# Patient Record
Sex: Female | Born: 1975 | Race: White | Hispanic: No | Marital: Married | Smoking: Never smoker
Health system: Southern US, Community
[De-identification: ages and names within clinical notes are randomized; demographics above are authoritative.]

## PROBLEM LIST (undated history)

## (undated) DIAGNOSIS — G43909 Migraine, unspecified, not intractable, without status migrainosus: Secondary | ICD-10-CM

## (undated) DIAGNOSIS — E538 Deficiency of other specified B group vitamins: Secondary | ICD-10-CM

## (undated) DIAGNOSIS — R5383 Other fatigue: Secondary | ICD-10-CM

## (undated) DIAGNOSIS — R0683 Snoring: Secondary | ICD-10-CM

## (undated) DIAGNOSIS — E559 Vitamin D deficiency, unspecified: Secondary | ICD-10-CM

## (undated) HISTORY — DX: Other fatigue: R53.83

## (undated) HISTORY — DX: Snoring: R06.83

## (undated) HISTORY — DX: Vitamin D deficiency, unspecified: E55.9

## (undated) HISTORY — PX: NO PAST SURGERIES: SHX2092

## (undated) HISTORY — DX: Migraine, unspecified, not intractable, without status migrainosus: G43.909

## (undated) HISTORY — DX: Deficiency of other specified B group vitamins: E53.8

---

## 2014-06-29 ENCOUNTER — Emergency Department (HOSPITAL_BASED_OUTPATIENT_CLINIC_OR_DEPARTMENT_OTHER): Payer: Medicaid Other

## 2014-06-29 ENCOUNTER — Emergency Department (HOSPITAL_BASED_OUTPATIENT_CLINIC_OR_DEPARTMENT_OTHER)
Admission: EM | Admit: 2014-06-29 | Discharge: 2014-06-30 | Disposition: A | Discharge status: Home / Self Care | Payer: Medicaid Other | Attending: Emergency Medicine | Admitting: Emergency Medicine

## 2014-06-29 ENCOUNTER — Encounter (HOSPITAL_BASED_OUTPATIENT_CLINIC_OR_DEPARTMENT_OTHER): Payer: Self-pay | Admitting: Emergency Medicine

## 2014-06-29 DIAGNOSIS — R1110 Vomiting, unspecified: Secondary | ICD-10-CM | POA: Insufficient documentation

## 2014-06-29 DIAGNOSIS — R55 Syncope and collapse: Secondary | ICD-10-CM | POA: Insufficient documentation

## 2014-06-29 DIAGNOSIS — R4020 Unspecified coma: Secondary | ICD-10-CM | POA: Diagnosis present

## 2014-06-29 DIAGNOSIS — R42 Dizziness and giddiness: Secondary | ICD-10-CM | POA: Diagnosis not present

## 2014-06-29 DIAGNOSIS — Z3202 Encounter for pregnancy test, result negative: Secondary | ICD-10-CM | POA: Diagnosis not present

## 2014-06-29 LAB — CBC WITH DIFFERENTIAL/PLATELET
BASOS ABS: 0.1 10*3/uL (ref 0.0–0.1)
Basophils Relative: 1 % (ref 0–1)
Eosinophils Absolute: 0.3 10*3/uL (ref 0.0–0.7)
Eosinophils Relative: 2 % (ref 0–5)
HCT: 39.9 % (ref 36.0–46.0)
Hemoglobin: 13.1 g/dL (ref 12.0–15.0)
LYMPHS ABS: 2.7 10*3/uL (ref 0.7–4.0)
LYMPHS PCT: 26 % (ref 12–46)
MCH: 28.8 pg (ref 26.0–34.0)
MCHC: 32.8 g/dL (ref 30.0–36.0)
MCV: 87.7 fL (ref 78.0–100.0)
Monocytes Absolute: 1 10*3/uL (ref 0.1–1.0)
Monocytes Relative: 9 % (ref 3–12)
NEUTROS PCT: 62 % (ref 43–77)
Neutro Abs: 6.6 10*3/uL (ref 1.7–7.7)
PLATELETS: 391 10*3/uL (ref 150–400)
RBC: 4.55 MIL/uL (ref 3.87–5.11)
RDW: 13.3 % (ref 11.5–15.5)
WBC: 10.7 10*3/uL — AB (ref 4.0–10.5)

## 2014-06-29 LAB — COMPREHENSIVE METABOLIC PANEL
ALT: 14 U/L (ref 0–35)
AST: 11 U/L (ref 0–37)
Albumin: 3.4 g/dL — ABNORMAL LOW (ref 3.5–5.2)
Alkaline Phosphatase: 106 U/L (ref 39–117)
Anion gap: 15 (ref 5–15)
BUN: 16 mg/dL (ref 6–23)
CO2: 21 meq/L (ref 19–32)
CREATININE: 0.8 mg/dL (ref 0.50–1.10)
Calcium: 8.9 mg/dL (ref 8.4–10.5)
Chloride: 105 mEq/L (ref 96–112)
GLUCOSE: 100 mg/dL — AB (ref 70–99)
Potassium: 3.7 mEq/L (ref 3.7–5.3)
Sodium: 141 mEq/L (ref 137–147)
Total Bilirubin: 0.3 mg/dL (ref 0.3–1.2)
Total Protein: 7.4 g/dL (ref 6.0–8.3)

## 2014-06-29 LAB — URINALYSIS, ROUTINE W REFLEX MICROSCOPIC
Bilirubin Urine: NEGATIVE
Glucose, UA: NEGATIVE mg/dL
Ketones, ur: NEGATIVE mg/dL
NITRITE: NEGATIVE
PH: 6 (ref 5.0–8.0)
Protein, ur: NEGATIVE mg/dL
Specific Gravity, Urine: 1.016 (ref 1.005–1.030)
Urobilinogen, UA: 0.2 mg/dL (ref 0.0–1.0)

## 2014-06-29 LAB — URINE MICROSCOPIC-ADD ON

## 2014-06-29 LAB — TROPONIN I: Troponin I: <0.3 ng/mL (ref <0.30)

## 2014-06-29 LAB — PREGNANCY, URINE: PREG TEST UR: NEGATIVE

## 2014-06-29 MED ORDER — MECLIZINE HCL 25 MG PO TABS
25.0000 mg | ORAL_TABLET | Freq: Once | ORAL | Status: AC
  Administered 2014-06-29: 25 mg via ORAL
  Filled 2014-06-29: qty 1

## 2014-06-29 MED ORDER — ONDANSETRON 4 MG PO TBDP
4.0000 mg | ORAL_TABLET | Freq: Once | ORAL | Status: AC
  Administered 2014-06-29: 4 mg via ORAL
  Filled 2014-06-29: qty 1

## 2014-06-29 NOTE — ED Provider Notes (Signed)
CSN: [id number]     Arrival date & time 06/29/14  2135 History  This chart was scribed for Charlesetta Shanks, MD by Rayfield Citizen, ED Scribe. This patient was seen in room MH11/MH11 and the patient's care was started at 10:04 PM.    Chief Complaint  Patient presents with  . Loss of Consciousness   The history is provided by the patient. No language interpreter was used.    HPI Comments: Kelly Marsh is a 38 y.o. female who presents to the Emergency Department complaining of new onset dizziness beginning this evening as she was getting ready for bed. She explains the sensation as spinning with an episode of "blacking out" where her vision was disturbed but she remained conscious and cognizant. Her dizziness is aggravated with position change. Patient reports that she has a history of headaches (for years), which last a week or two at a time with associated dizziness, but never to this level of severity. She states that she vomited 1x last night. She denies chest pain or nausea with these current symptoms.   She reports that she has had high blood pressure in the past but has not taken medication for that issue. Patient does not take any medication other than birth control pills.   History reviewed. No pertinent past medical history. History reviewed. No pertinent past surgical history. No family history on file. History  Substance Use Topics  . Smoking status: Never Smoker   . Smokeless tobacco: Not on file  . Alcohol Use: No   OB History   Grav Para Term Preterm Abortions TAB SAB Ect Mult Living                 Review of Systems  Cardiovascular: Negative for chest pain.  Gastrointestinal: Positive for vomiting (1x, 1 day before symptom onset). Negative for nausea.  Neurological: Positive for dizziness, syncope (Near syncope) and light-headedness.      Allergies  Review of patient's allergies indicates no known allergies.  Home Medications   Prior to Admission medications   Not on  File   BP 137/82  Pulse 78  Temp(Src) 98.3 F (36.8 C) (Oral)  Resp 20  Ht 5\' 6"  (1.676 m)  Wt 230 lb (104.327 kg)  BMI 37.14 kg/m2  SpO2 100%  LMP 06/15/2014 Physical Exam  Nursing note and vitals reviewed. Constitutional: She is oriented to person, place, and time. She appears well-developed and well-nourished.  HENT:  Head: Normocephalic and atraumatic.  Right Ear: External ear normal.  Left Ear: External ear normal.  Mouth/Throat: Oropharynx is clear and moist. No oropharyngeal exudate.  Eyes: Conjunctivae and EOM are normal. Pupils are equal, round, and reactive to light.  Cardiovascular: Normal rate, regular rhythm and normal heart sounds.  Exam reveals no gallop and no friction rub.   No murmur heard. Pulmonary/Chest: Effort normal and breath sounds normal. No respiratory distress. She has no wheezes. She has no rales.  Abdominal: Soft. There is no tenderness. There is no rebound and no guarding.  Musculoskeletal: Normal range of motion. She exhibits no edema.  Neurological: She is alert and oriented to person, place, and time.  Good symmetric strength, normal coordination with heel-to-shin  Skin: Skin is warm and dry. No rash noted.  Psychiatric: She has a normal mood and affect. Her behavior is normal.    ED Course  Procedures   DIAGNOSTIC STUDIES: Oxygen Saturation is 100% on RA, normal by my interpretation.    COORDINATION OF CARE: 10:10 PM Discussed  treatment plan with pt at bedside and pt agreed to plan.   Labs Review Labs Reviewed  COMPREHENSIVE METABOLIC PANEL - Abnormal; Notable for the following:    Glucose, Bld 100 (*)    Albumin 3.4 (*)    All other components within normal limits  CBC WITH DIFFERENTIAL - Abnormal; Notable for the following:    WBC 10.7 (*)    All other components within normal limits  URINALYSIS, ROUTINE W REFLEX MICROSCOPIC - Abnormal; Notable for the following:    Hgb urine dipstick MODERATE (*)    Leukocytes, UA TRACE (*)     All other components within normal limits  PREGNANCY, URINE  TROPONIN I  URINE MICROSCOPIC-ADD ON    Imaging Review Ct Head Wo Contrast  06/29/2014   CLINICAL DATA:  Loss of consciousness.  Initial encounter  EXAM: CT HEAD WITHOUT CONTRAST  TECHNIQUE: Contiguous axial images were obtained from the base of the skull through the vertex without intravenous contrast.  COMPARISON:  None.  FINDINGS: Skull and Sinuses:Negative for fracture or destructive process. The mastoids, middle ears, and imaged paranasal sinuses are clear.  Orbits: No acute abnormality.  Brain: No evidence of acute abnormality, such as acute infarction, hemorrhage, hydrocephalus, or mass lesion/mass effect. Globus pallidus calcification is prominent for age but not strictly pathologic.  IMPRESSION: No acute intracranial disease.   Electronically Signed   By: Jorje Guild M.D.   On: 06/29/2014 22:44     EKG Interpretation   Date/Time:  Monday June 29 2014 21:57:12 EDT Ventricular Rate:  84 PR Interval:  120 QRS Duration: 80 QT Interval:  374 QTC Calculation: 441 R Axis:   72 Text Interpretation:  Normal sinus rhythm Normal ECG agree Confirmed by  Johnney Killian, MD, Jeannie Done [phone]) on 06/29/2014 10:40:42 PM      MDM   Final diagnoses:  Vertigo  Near syncope    This point the patient has symptoms of vertigo with a long-standing history of headaches. CT head does not show any acute findings neurologic examination is normal. This does not sound cardiac syncope, EKG is normal. I will treat the patient for vertiginous symptoms and have her followup with her family physician.     Charlesetta Shanks, MD 06/30/14 0110

## 2014-06-29 NOTE — ED Notes (Signed)
States she passed out tonight while getting ready for bed. Denies pain.

## 2014-06-29 NOTE — ED Notes (Signed)
Patient transported to CT 

## 2014-06-30 MED ORDER — MECLIZINE HCL 50 MG PO TABS: 50.0000 mg | ORAL_TABLET | Freq: Three times a day (TID) | ORAL | Status: DC | PRN

## 2014-06-30 MED ORDER — ONDANSETRON HCL 4 MG PO TABS: 4.0000 mg | ORAL_TABLET | Freq: Four times a day (QID) | ORAL | Status: DC

## 2014-06-30 NOTE — Discharge Instructions (Signed)

## 2015-04-13 ENCOUNTER — Ambulatory Visit (INDEPENDENT_AMBULATORY_CARE_PROVIDER_SITE_OTHER): Payer: Medicaid Other | Admitting: Neurology

## 2015-04-13 ENCOUNTER — Encounter: Payer: Self-pay | Admitting: Neurology

## 2015-04-13 VITALS — BP 135/92 | HR 73 | Temp 98.1°F | Ht 66.0 in | Wt 245.6 lb

## 2015-04-13 DIAGNOSIS — G43101 Migraine with aura, not intractable, with status migrainosus: Secondary | ICD-10-CM

## 2015-04-13 DIAGNOSIS — R51 Headache: Secondary | ICD-10-CM | POA: Diagnosis not present

## 2015-04-13 DIAGNOSIS — R519 Headache, unspecified: Secondary | ICD-10-CM

## 2015-04-13 DIAGNOSIS — H538 Other visual disturbances: Secondary | ICD-10-CM | POA: Diagnosis not present

## 2015-04-13 DIAGNOSIS — H905 Unspecified sensorineural hearing loss: Secondary | ICD-10-CM | POA: Diagnosis not present

## 2015-04-13 DIAGNOSIS — H9190 Unspecified hearing loss, unspecified ear: Secondary | ICD-10-CM | POA: Insufficient documentation

## 2015-04-13 MED ORDER — TOPIRAMATE 25 MG PO TABS: 25.0000 mg | ORAL_TABLET | Freq: Two times a day (BID) | ORAL | Status: DC

## 2015-04-13 MED ORDER — SUMATRIPTAN SUCCINATE 100 MG PO TABS: 100.0000 mg | ORAL_TABLET | Freq: Once | ORAL | Status: DC

## 2015-04-13 NOTE — Progress Notes (Signed)
[facility]    Provider:  Dr Jaynee Eagles Referring Provider: Kristie Cowman, MD Primary Care Physician:  No primary care provider on file.  CC:  Headaches  HPI:  Kelly Marsh is a 39 y.o. female here as a referral from Dr. Ronnald Ramp for headaches.   Headaches started at the age of 79. No headaches in the family. No inciting factors, no trauma. Headaches are getting worse recently. They are more on the left but bilateraly in the forehead and it behind both eyes. She endorses light sensitivity, sound sensitivity which worsens headache. Feels throbbing. No aura. She has a continuous headache. Worse in the afternoon. She wakes up with them. She is excessively tired. She is taking Soma and tylenol 3. She uses zofran for nausea. She has nausea, she has vomited. She gets little bubbles before the headache starts. She uses cold rags which help. She can get to 10/10 pain. She has blurry vision with the headaches and lasts for days. Her head gets full, ears are full. Her scalp gets sensitive. She sometimes has to go into a dark room which helps. She always has a low-level headache 3-4/10 daily. Last time she had eyes checked was a while ago. She has had 5 kids, not having anymore and uses birth control.   Reviewed notes, labs and imaging from outside physicians, which showed:  CT head 06/2014: personally reviewed images Skull and Sinuses:Negative for fracture or destructive process. The mastoids, middle ears, and imaged paranasal sinuses are clear.  Orbits: No acute abnormality.  Brain: No evidence of acute abnormality, such as acute infarction, hemorrhage, hydrocephalus, or mass lesion/mass effect. Globus pallidus calcification is prominent for age but not strictly pathologic.  IMPRESSION: No acute intracranial disease.  Review of Systems: Patient complains of symptoms per HPI as well as the following symptoms: Headache. No CP, no SOB. Pertinent negatives per HPI. All others  negative.   History   Social History  . Marital Status: Married    Spouse Name: Tannya Gonet   . Number of Children: 5  . Years of Education: 10   Occupational History  . Not on file.   Social History Main Topics  . Smoking status: Never Smoker   . Smokeless tobacco: Not on file  . Alcohol Use: No  . Drug Use: No  . Sexual Activity: Not on file   Other Topics Concern  . Not on file   Social History Narrative   Lives at home with husband and family   Caffeine use: 1 cup coffee per week       Family History  Problem Relation Age of Onset  . Diabetes Mother   . Cancer Maternal Grandmother   . Cancer Maternal Grandfather     Past Medical History  Diagnosis Date  . Migraine     Past Surgical History  Procedure Laterality Date  . No past surgeries      Current Outpatient Prescriptions  Medication Sig Dispense Refill  . acetaminophen-codeine (TYLENOL #3) 300-30 MG per tablet Take 1 tablet by mouth as needed.  0  . CAZIANT 0.1/0.125/0.15 -0.025 MG tablet TK 1 T PO D  11  . methocarbamol (ROBAXIN) 500 MG tablet Take 500 mg by mouth as needed.  0  . ondansetron (ZOFRAN) 8 MG tablet Take 8 mg by mouth as needed.  0  . meclizine (ANTIVERT) 50 MG tablet Take 1 tablet (50 mg total) by mouth 3 (three) times daily as needed. (Patient not taking: Reported on 04/13/2015) 30  tablet 0   No current facility-administered medications for this visit.    Allergies as of 04/13/2015  . (No Known Allergies)    Vitals: BP 135/92 mmHg  Pulse 73  Temp(Src) 98.1 F (36.7 C) (Oral)  Ht 5\' 6"  (1.676 m)  Wt 245 lb 9.6 oz (111.403 kg)  BMI 39.66 kg/m2 Last Weight:  Wt Readings from Last 1 Encounters:  04/13/15 245 lb 9.6 oz (111.403 kg)   Last Height:   Ht Readings from Last 1 Encounters:  04/13/15 5\' 6"  (1.676 m)   Physical exam: Exam: Gen: NAD, conversant, well nourised, well groomed                     CV: RRR, no MRG. No Carotid Bruits. No peripheral edema, warm,  nontender Eyes: Conjunctivae clear without exudates or hemorrhage  Neuro: Detailed Neurologic Exam  Speech:    Speech is normal; fluent and spontaneous with normal comprehension.  Cognition:    The patient is oriented to person, place, and time;     recent and remote memory intact;     language fluent;     normal attention, concentration,     fund of knowledge Cranial Nerves:    The pupils are equal, round, and reactive to light. The fundi are normal and spontaneous venous pulsations are present. Visual fields are full to finger confrontation. Extraocular movements are intact. Trigeminal sensation is intact and the muscles of mastication are normal. The face is symmetric. The palate elevates in the midline. Hearing intact. Voice is normal. Shoulder shrug is normal. The tongue has normal motion without fasciculations.   Coordination:    Normal finger to nose and heel to shin. Normal rapid alternating movements.   Gait:    Heel-toe and tandem gait are normal.   Motor Observation:    No asymmetry, no atrophy, and no involuntary movements noted. Tone:    Normal muscle tone.    Posture:    Posture is normal. normal erect    Strength:    Strength is V/V in the upper and lower limbs.      Sensation: intact to LT     Reflex Exam:  DTR's:    Deep tendon reflexes in the upper and lower extremities are normal bilaterally.   Toes:    The toes are downgoing bilaterally.   Clonus:    Clonus is absent.       Assessment/Plan:  39 year old female with migraines.   Topiramate 25mg  twice daily. If needed we can increase medication. Do not get pregnant. Discussed teratogenicity. Use birth control.  imitrex 100mg  at onset of headache. Please take one tablet at the onset of your headache. If it does not improve the symptoms please take one additional tablet. Do not take more then 2 tablets in 24hrs. Do not take use more then 2 to 3 times in a week. Do not take OTC medications more than  2-3x in a week to avoid rebound.  MRi of the brain  Sarina Ill, MD  [facility] [address] [place], [place] [address]  Phone [phone] Fax [phone]

## 2015-04-13 NOTE — Patient Instructions (Signed)
Overall you are doing fairly well but I do want to suggest a few things today:   Remember to drink plenty of fluid, eat healthy meals and do not skip any meals. Try to eat protein with a every meal and eat a healthy snack such as fruit or nuts in between meals. Try to keep a regular sleep-wake schedule and try to exercise daily, particularly in the form of walking, 20-30 minutes a day, if you can.   As far as your medications are concerned, I would like to suggest: Topiramate 25mg  twice daily. If needed we can increase medication. Do not get pregnant. imitrex 100mg  at onset of headache. Please take one tablet at the onset of your headache. If it does not improve the symptoms please take one additional tablet. Do not take more then 2 tablets in 24hrs. Do not take use more then 2 to 3 times in a week. Do not take OTC medications more than 2-3x in a week to avoid rebound.   As far as diagnostic testing: MRI of the brain  I would like to see you back in 3 months, sooner if we need to. Please call us with any interim questions, concerns, problems, updates or refill requests.   Please also call us for any test results so we can go over those with you on the phone.  My clinical assistant and will answer any of your questions and relay your messages to me and also relay most of my messages to you.   Our phone number is [phone]. We also have an after hours call service for urgent matters and there is a physician on-call for urgent questions. For any emergencies you know to call 911 or go to the nearest emergency room

## 2015-04-14 ENCOUNTER — Telehealth: Payer: Self-pay | Admitting: *Deleted

## 2015-04-14 LAB — COMPREHENSIVE METABOLIC PANEL
ALT: 11 IU/L (ref 0–32)
AST: 9 IU/L (ref 0–40)
Albumin/Globulin Ratio: 1.5 (ref 1.1–2.5)
Albumin: 4.3 g/dL (ref 3.5–5.5)
Alkaline Phosphatase: 91 IU/L (ref 39–117)
BUN/Creatinine Ratio: 16 (ref 8–20)
BUN: 11 mg/dL (ref 6–20)
Bilirubin Total: 0.4 mg/dL (ref 0.0–1.2)
CO2: 21 mmol/L (ref 18–29)
Calcium: 9.5 mg/dL (ref 8.7–10.2)
Chloride: 102 mmol/L (ref 97–108)
Creatinine, Ser: 0.7 mg/dL (ref 0.57–1.00)
GFR calc Af Amer: 127 mL/min/{1.73_m2} (ref >59)
GFR, EST NON AFRICAN AMERICAN: 110 mL/min/{1.73_m2} (ref >59)
GLUCOSE: 100 mg/dL — AB (ref 65–99)
Globulin, Total: 2.9 g/dL (ref 1.5–4.5)
POTASSIUM: 5.2 mmol/L (ref 3.5–5.2)
SODIUM: 139 mmol/L (ref 134–144)
Total Protein: 7.2 g/dL (ref 6.0–8.5)

## 2015-04-14 LAB — CBC
HEMOGLOBIN: 14.1 g/dL (ref 11.1–15.9)
Hematocrit: 43.4 % (ref 34.0–46.6)
MCH: 28.3 pg (ref 26.6–33.0)
MCHC: 32.5 g/dL (ref 31.5–35.7)
MCV: 87 fL (ref 79–97)
PLATELETS: 445 10*3/uL — AB (ref 150–379)
RBC: 4.99 x10E6/uL (ref 3.77–5.28)
RDW: 13.4 % (ref 12.3–15.4)
WBC: 8.6 10*3/uL (ref 3.4–10.8)

## 2015-04-14 NOTE — Telephone Encounter (Signed)
Spoke w/ pt about unremarkable lab results. Pt verbalized understanding. No further questions at this time.

## 2015-04-18 ENCOUNTER — Encounter: Payer: Self-pay | Admitting: Neurology

## 2015-06-30 ENCOUNTER — Ambulatory Visit (INDEPENDENT_AMBULATORY_CARE_PROVIDER_SITE_OTHER): Payer: Medicaid Other | Admitting: Neurology

## 2015-06-30 ENCOUNTER — Encounter: Payer: Self-pay | Admitting: Neurology

## 2015-06-30 VITALS — BP 143/90 | HR 78 | Ht 66.0 in | Wt 243.0 lb

## 2015-06-30 DIAGNOSIS — R519 Headache, unspecified: Secondary | ICD-10-CM

## 2015-06-30 DIAGNOSIS — R51 Headache: Secondary | ICD-10-CM | POA: Diagnosis not present

## 2015-06-30 DIAGNOSIS — G43101 Migraine with aura, not intractable, with status migrainosus: Secondary | ICD-10-CM | POA: Diagnosis not present

## 2015-06-30 DIAGNOSIS — R0683 Snoring: Secondary | ICD-10-CM | POA: Diagnosis not present

## 2015-06-30 DIAGNOSIS — G4719 Other hypersomnia: Secondary | ICD-10-CM | POA: Diagnosis not present

## 2015-06-30 MED ORDER — NORTRIPTYLINE HCL 10 MG PO CAPS: 20.0000 mg | ORAL_CAPSULE | Freq: Every day | ORAL | Status: DC

## 2015-06-30 NOTE — Patient Instructions (Addendum)
Overall you are doing fairly well but I do want to suggest a few things today:   Remember to drink plenty of fluid, eat healthy meals and do not skip any meals. Try to eat protein with a every meal and eat a healthy snack such as fruit or nuts in between meals. Try to keep a regular sleep-wake schedule and try to exercise daily, particularly in the form of walking, 20-30 minutes a day, if you can.   As far as your medications are concerned, I would like to suggest: Nortriptyline 10mg  an hour before bed. In 2-3 weeks if without side effects can increase to 2 pills. Stop Topamax and Imitrex,  As far as diagnostic testing: MRI of the brain, sleep study  I would like to see you back in 3 months, sooner if we need to. Please call us with any interim questions, concerns, problems, updates or refill requests.   Our phone number is [phone]. We also have an after hours call service for urgent matters and there is a physician on-call for urgent questions. For any emergencies you know to call 911 or go to the nearest emergency room

## 2015-06-30 NOTE — Progress Notes (Signed)
[facility]    Provider:  Dr Jaynee Eagles Referring Provider: No ref. provider found Primary Care Physician:  No primary care provider on file.  Provider: Dr Jaynee Eagles Referring Provider: Kristie Cowman, MD Primary Care Physician: No primary care provider on file.  CC: Headaches  Interval History: She started the Topamax a month into it. She was having side effects to the Topamax and the imitrex. The imitrex made her feel like she she was choking. The topamax made her feel dizzy and headaches didn;t get better. She is having the headaches constantly for 8 days and then gets a break and then they restart. She snores, she wake up with headaches, she woke up with one at 4am and she can't think straight. Excessively tired during the day, she can take a nap anytime, she gained weight recently. In September she had 20 headache days and in October so far she has 10 headache days. She has insomnia very often. Sounds are triggers and she gets headaches when she is working out.   HPI: Kelly Marsh is a 39 y.o. female here as a referral from Dr. Ronnald Ramp for headaches.   Headaches started at the age of 62. No headaches in the family. No inciting factors, no trauma. Headaches are getting worse recently. They are more on the left but bilateraly in the forehead and it behind both eyes. She endorses light sensitivity, sound sensitivity which worsens headache. Feels throbbing. No aura. She has a continuous headache. Worse in the afternoon. She wakes up with them. She is excessively tired. She is taking Soma and tylenol 3. She uses zofran for nausea. She has nausea, she has vomited. She gets little bubbles before the headache starts. She uses cold rags which help. She can get to 10/10 pain. She has blurry vision with the headaches and lasts for days. Her head gets full, ears are full. Her scalp gets sensitive. She sometimes has to go into a dark room which helps. She always has a low-level headache 3-4/10  daily. Last time she had eyes checked was a while ago. She has had 5 kids, not having anymore and uses birth control.   Reviewed notes, labs and imaging from outside physicians, which showed:  CT head 06/2014: personally reviewed images Skull and Sinuses:Negative for fracture or destructive process. The mastoids, middle ears, and imaged paranasal sinuses are clear.  Orbits: No acute abnormality.  Brain: No evidence of acute abnormality, such as acute infarction, hemorrhage, hydrocephalus, or mass lesion/mass effect. Globus pallidus calcification is prominent for age but not strictly pathologic.  IMPRESSION: No acute intracranial disease. Review of Systems: Patient complains of symptoms per HPI as well as the following symptoms: No CP, No SOB. Pertinent negatives per HPI. All others negative.   Social History   Social History  . Marital Status: Married    Spouse Name: Angelic Schnelle   . Number of Children: 5  . Years of Education: 10   Occupational History  . Not on file.   Social History Main Topics  . Smoking status: Never Smoker   . Smokeless tobacco: Not on file  . Alcohol Use: No  . Drug Use: No  . Sexual Activity: Not on file   Other Topics Concern  . Not on file   Social History Narrative   Lives at home with husband and family   Caffeine use: 1 cup coffee per week       Family History  Problem Relation Age of Onset  . Diabetes Mother   .  Cancer Maternal Grandmother   . Cancer Maternal Grandfather   . Migraines Neg Hx     Past Medical History  Diagnosis Date  . Migraine     Past Surgical History  Procedure Laterality Date  . No past surgeries      Current Outpatient Prescriptions  Medication Sig Dispense Refill  . CAZIANT 0.1/0.125/0.15 -0.025 MG tablet TK 1 T PO D  11  . meclizine (ANTIVERT) 50 MG tablet Take 1 tablet (50 mg total) by mouth 3 (three) times daily as needed. 30 tablet 0  . ondansetron (ZOFRAN) 8 MG tablet Take 8 mg by mouth as  needed.  0  . SUMAtriptan (IMITREX) 100 MG tablet Take 1 tablet (100 mg total) by mouth once. May repeat in 2 hours if headache persists or recurs. 10 tablet 6  . topiramate (TOPAMAX) 25 MG tablet Take 1 tablet (25 mg total) by mouth 2 (two) times daily. 60 tablet 6   No current facility-administered medications for this visit.    Allergies as of 06/30/2015  . (No Known Allergies)    Vitals: BP 143/90 mmHg  Pulse 78  Ht 5\' 6"  (1.676 m)  Wt 243 lb (110.224 kg)  BMI 39.24 kg/m2 Last Weight:  Wt Readings from Last 1 Encounters:  06/30/15 243 lb (110.224 kg)   Last Height:   Ht Readings from Last 1 Encounters:  06/30/15 5\' 6"  (1.676 m)       Exam: Gen: NAD, conversant, well nourised, well groomed  CV: RRR, no MRG. No Carotid Bruits. No peripheral edema, warm, nontender Eyes: Conjunctivae clear without exudates or hemorrhage  Neuro: Detailed Neurologic Exam  Speech:  Speech is normal; fluent and spontaneous with normal comprehension.  Cognition:  The patient is oriented to person, place, and time;   recent and remote memory intact;   language fluent;   normal attention, concentration,   fund of knowledge Cranial Nerves:  The pupils are equal, round, and reactive to light. The fundi are normal and spontaneous venous pulsations are present. Visual fields are full to finger confrontation. Extraocular movements are intact. Trigeminal sensation is intact and the muscles of mastication are normal. The face is symmetric. The palate elevates in the midline. Hearing intact. Voice is normal. Shoulder shrug is normal. The tongue has normal motion without fasciculations.   Coordination:  Normal finger to nose and heel to shin. Normal rapid alternating movements.   Gait:  Heel-toe and tandem gait are normal.   Motor Observation:  No asymmetry, no atrophy, and no involuntary movements noted. Tone:  Normal muscle tone.    Posture:  Posture is normal. normal erect   Strength:  Strength is V/V in the upper and lower limbs.    Sensation: intact to LT   Reflex Exam:  DTR's:  Deep tendon reflexes in the upper and lower extremities are normal bilaterally.  Toes:  The toes are downgoing bilaterally.  Clonus:  Clonus is absent.      Assessment/Plan: 39 year old female with migraines.   Sleep study for nocturnal headaches waking her up, smoring, excessive daytime fatigue, obesity. Nortriptyline 10mg  an hour before bed. In 2-3 weeks if without side effects can increase to 2 pills. Stop Topamax and Imitrex, MRI of the brain Discussed side effects including teratogenicity, do not Pregnant on this medication and use birth control. Serious side effects can include hypotension, hypertension, syncope, ventricular arrhythmias, QT prolongation and other cardiac side effects, stroke and seizures, ataxia tardive dyskinesias, extrapyramidal symptoms, increased intraocular  pressure, leukopenia, thrombocytopenia, hallucinations, suicidality and other serious side effects. Common reactions include drowsiness, dry mouth, dizziness, constipation, blurred vision, palpitations, tachycardia, impaired coordination, increased appetite, nausea vomiting, weakness, confusion, disorientation, restlessness, anxiety and other side effects.  A total of 25 minutes was spent in with this patient face to face. Over half this time was spent on counseling patient on the migraine diagnosis and different therapeutic options available.

## 2015-07-05 ENCOUNTER — Encounter: Payer: Self-pay | Admitting: Neurology

## 2015-07-05 ENCOUNTER — Ambulatory Visit (INDEPENDENT_AMBULATORY_CARE_PROVIDER_SITE_OTHER): Payer: Medicaid Other | Admitting: Neurology

## 2015-07-05 VITALS — BP 132/88 | HR 82 | Resp 20 | Ht 66.0 in | Wt 243.0 lb

## 2015-07-05 DIAGNOSIS — R0683 Snoring: Secondary | ICD-10-CM | POA: Diagnosis not present

## 2015-07-05 DIAGNOSIS — G43111 Migraine with aura, intractable, with status migrainosus: Secondary | ICD-10-CM | POA: Diagnosis not present

## 2015-07-05 DIAGNOSIS — G4730 Sleep apnea, unspecified: Secondary | ICD-10-CM | POA: Diagnosis not present

## 2015-07-05 DIAGNOSIS — G4753 Recurrent isolated sleep paralysis: Secondary | ICD-10-CM

## 2015-07-05 DIAGNOSIS — G44011 Episodic cluster headache, intractable: Secondary | ICD-10-CM | POA: Diagnosis not present

## 2015-07-05 NOTE — Patient Instructions (Signed)
Cluster Headache Cluster headaches are deeply painful. They normally occur on one side of your head, but they may switch sides. Often, cluster headaches:  Are severe.  Happen often for a few weeks or months and then go away for a while.  Last from 15 minutes to 3 hours.  Happen at the same time each day.  Happen at night.  Happen many times a day. HOME CARE  During times when you have cluster headaches:  Get the same amount of sleep every night, at the same time each night.  Avoid alcohol.  Stop smoking if you smoke. GET HELP IF:  There are changes in how bad or how often your headaches happen.  Your medicines are not helping. GET HELP RIGHT AWAY IF:  You pass out (faint).  You become weak or lose feeling (have numbness) on one side of your body or face.  You see two of everything (double vision).  You feel sick to your stomach (nauseous) or throw up (vomit) and do not stop after several hours.  You are off balance or have trouble talking or walking.  You have neck pain or stiffness.  You have a fever. MAKE SURE YOU:  Understand these instructions.  Will watch your condition.  Will get help right away if you are not doing well or get worse.   This information is not intended to replace advice given to you by your health care provider. Make sure you discuss any questions you have with your health care provider.   Document Released: 10/12/2004 Document Revised: 09/25/2014 Document Reviewed: 03/27/2013 Elsevier Interactive Patient Education 2016 [employer]  

## 2015-07-05 NOTE — Progress Notes (Signed)
SLEEP MEDICINE CLINIC   Provider:  Larey Seat, M D  Referring Provider: Melvenia Beam, MD Primary Care Physician:  No primary care provider on file.  Chief Complaint  Patient presents with  . New Patient (Initial Visit)    snoring, sleep consult, headaches in morning, rm 10, with husband and daughter    HPI:  Kelly Marsh is a 39 y.o. female , seen here as a referral from Kelly Marsh for a sleep evaluation  Chief complaint according to patient : I am awoken by headaches.    [name] reports that she has had sleep related headaches for many years. Some headaches will wake her from sleep and are severe sudden headache attacks. She sometimes cannot fall back asleep after those attacks and they seem to appear in clusters. She tends to have about 8 day days of serial nights with headaches and then she will be off for couple of weeks. This is a cyclic pattern that has been present for over a year may be longer than 5 years. She has now reached a stage where she has more days with headaches than without. She also awakens with headaches in the morning and states that the headaches do not resolve over the next couple of hours but actually get worse. But her nocturnal headaches affect her left eye left temple and sometimes cause her to tear or to feel flushed her morning headaches have a different quality. They feel much more like a vice around her head for head and temples, lights and sounds seem to trigger headaches. She will get also nauseated also she may not have to vomit. Is a migrainous and tension component to her headaches.  Sleep habits are as follows: She describes her bedroom is cool and quiet and dark, she retires to the bedroom around 8 PM, she states that her average sleep latency may be around 30 minutes sometimes longer. She also has fragmented sleep and wakes up frequently. Not all of her wake up periods are related to headaches, she will on average wake up twice at night to  urinate. She wakes up in the morning around 6:15 spontaneously without an alarm. Often by that time she has already woken up at 5 or 4 AM.  She estimates about 5 hours of sleep, nocturnally.  She reports vivid dreams in the morning hours that may also lead to these arousals. Some of them are nightmarish in character but most of them are just vivid. Sometimes when the dream is interrupted and she falls back asleep, she can continue the same dream content. She has woken up with sleep paralysis she has also reported some paralysis attacks in daytime. Her husband will wake her up if she sleeps supine, due to him bothered by loud snoirng. He has witnessed gasping and irregular breathing as well.  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 She can take daytime naps. She feels sometimes an irresistible urge to sleep. A nap can be in the morning hours as well as in the afternoon, she does not feel that the naps are refreshing or restoring. This seemed to promote a headache , too.  She does not drink caffeine at it beverages. She no longer takes NSAIDS.    Sleep medical history and family sleep history:  No family history , one son snores. Mother snores.  Social history:  Married, homemaker. Non smoker  Non drinker.   Review of Systems: Out of a complete 14  system review, the patient complains of only the following symptoms, and all other reviewed systems are negative.   Epworth score  11, Fatigue severity score 60  , depression score see below.    Social History   Social History  . Marital Status: Married    [name]   . Number of Children: 5  . Years of Education: 10   Occupational History  . Not on file.   Social History Main Topics  . Smoking status: Never Smoker   . Smokeless tobacco: Not on file  . Alcohol Use: No  . Drug Use: No  . Sexual Activity: Not on file   Other Topics Concern  . Not on file   Social History Narrative   Lives at home with husband  and family   Caffeine use: 1 cup coffee per week       Family History  Problem Relation Age of Onset  . Diabetes Mother   . Cancer Maternal Grandmother   . Cancer Maternal Grandfather   . Migraines Neg Hx     Past Medical History  Diagnosis Date  . Migraine   . Snoring     Past Surgical History  Procedure Laterality Date  . No past surgeries      Current Outpatient Prescriptions  Medication Sig Dispense Refill  . CAZIANT 0.1/0.125/0.15 -0.025 MG tablet TK 1 T PO D  11  . nortriptyline (PAMELOR) 10 MG capsule Take 2 capsules (20 mg total) by mouth at bedtime. 60 capsule 6  . ondansetron (ZOFRAN) 8 MG tablet Take 8 mg by mouth as needed.  0  . meclizine (ANTIVERT) 50 MG tablet Take 1 tablet (50 mg total) by mouth 3 (three) times daily as needed. (Patient not taking: Reported on 07/05/2015) 30 tablet 0   No current facility-administered medications for this visit.    Allergies as of 07/05/2015  . (No Known Allergies)    Vitals: BP 132/88 mmHg  Pulse 82  Resp 20  Ht 5\' 6"  (1.676 m)  Wt 243 lb (110.224 kg)  BMI 39.24 kg/m2 Last Weight:  Wt Readings from Last 1 Encounters:  07/05/15 243 lb (110.224 kg)   WVP:XTGG mass index is 39.24 kg/(m^2).     Last Height:   Ht Readings from Last 1 Encounters:  07/05/15 5\' 6"  (1.676 m)    Physical exam:  General: The patient is awake, alert and appears not in acute distress. The patient is well groomed. Head: Normocephalic, atraumatic. Neck is supple. Mallampati 2 , no tonsillectomy neck circumference: 14 . Nasal airflow  Unrestricted , TMJ is evident .  Bruxism. Retrognathia is not seen. No dentures and no history of retainers.   Cardiovascular:  Regular rate and rhythm, without  murmurs or carotid bruit, and without distended neck veins. Respiratory: Lungs are clear to auscultation. Skin:  Without evidence of edema, or rash Trunk: BMI is elevated . The patient's posture is erect   Neurologic exam : The patient is awake  and alert, oriented to place and time.   Memory subjective described as intact.     Attention span & concentration ability appears normal.  Speech is fluent,  without dysarthria, dysphonia or aphasia.  Mood and affect are appropriate.  Cranial nerves: Pupils are equal and briskly reactive to light. Funduscopic exam without  evidence of pallor or edema.  Extraocular movements  in vertical and horizontal planes intact and without nystagmus. Visual fields by finger perimetry are intact. Hearing to finger rub  intact.   Facial sensation intact to fine touch.  Facial motor strength is symmetric and tongue and uvula move midline. Shoulder shrug was symmetrical.   Motor exam:  Normal tone, muscle bulk and symmetric strength in all extremities.  Sensory:  Fine touch, pinprick and vibration were tested in all extremities. Proprioception tested in the upper extremities was normal.  Coordination: Rapid alternating movements in the fingers/hands was normal. Finger-to-nose maneuver  normal without evidence of ataxia, dysmetria or tremor.  Gait and station: Patient walks without assistive device and is able unassisted to climb up to the exam table. Strength within normal limits.  Stance is stable and normal.  Tandem gait is unfragmented. Turns with 3  Steps. Deep tendon reflexes: in the  upper and lower extremities are symmetric and intact. Babinski maneuver response is down going downgoing.  The patient was advised of the nature of the diagnosed sleep disorder , the treatment options and risks for general a health and wellness arising from not treating the condition.  I spent more than 55  minutes of face to face time with the patient. Greater than 50% of time was spent in counseling and coordination of care. We have discussed the diagnosis and differential and I answered the patient's questions.     Assessment:  After physical and neurologic examination, review of laboratory studies,  Personal review  of imaging studies, reports of other /same  Imaging studies ,  Results of polysomnography/ neurophysiology testing and pre-existing records as far as provided in visit., my assessment is   1) Mrs. Thammavong describes sleep related headaches, in  distinctly different patterns. #1 is a cluster headache that wakes her in the middle of the night often in the earlier hours of the morning with an the first 2 hours of falling asleep. His will not last very long but is a severe sudden attack it causes her to have tearing in her left eye a pressure or stabbing sensation to the left had and flushing.  #2The second is a morning headache that she wakes up with but is not awoken by. This headache is more dull that of a tension component with advice feeling around her head. It is not unilateral but associated with photophobia, phonophobia and causes her to feel nauseated, all bite she rarely ever has to vomit from it. She is definitely not tolerating food and she has this kind of sensation.  3) Mr. Thoreson has seen her breathing irregularly, and he has witnessed snoring which seems to be positional dependent and worse in supine sleep.  4) Mrs. Kinnamon has significant risk factors for obstructive sleep apnea and possible CO2 retention.  She has an elevated body mass index, her upper airway is moderately narrowed, and she prefers to sleep on the side as she wakens more frequently if she is in supine. 5)She is also significantly depressed.     Plan:  Treatment plan and additional workup : I will order an attended sleep study for Mrs. Hege that includes capnography to measure CO2 retention pulse oximetry to measure O2 saturation and I'm interested in seeing as the cluster headaches that she describes arise out of slow-wave sleep or REM sleep. She has reported isolated sleep paralysis even in daytime and vivid dreams as well as very fragmented sleep. However she does not really feel refreshed by naps in daytime. She is not aware  of any cataplectic attacks.     Asencion Partridge Alfonza Toft MD  07/05/2015   CC: Melvenia Beam, Md  [place], [place] [address]

## 2015-07-12 ENCOUNTER — Ambulatory Visit: Payer: Medicaid Other | Admitting: Neurology

## 2015-07-18 ENCOUNTER — Encounter: Payer: Self-pay | Admitting: Neurology

## 2015-07-19 ENCOUNTER — Other Ambulatory Visit: Payer: Self-pay | Admitting: Neurology

## 2015-07-19 MED ORDER — NORTRIPTYLINE HCL 50 MG PO CAPS: 50.0000 mg | ORAL_CAPSULE | Freq: Every day | ORAL | Status: DC

## 2015-07-20 ENCOUNTER — Ambulatory Visit (INDEPENDENT_AMBULATORY_CARE_PROVIDER_SITE_OTHER): Payer: Medicaid Other | Admitting: Neurology

## 2015-07-20 DIAGNOSIS — G44011 Episodic cluster headache, intractable: Secondary | ICD-10-CM

## 2015-07-20 DIAGNOSIS — G4753 Recurrent isolated sleep paralysis: Secondary | ICD-10-CM

## 2015-07-20 DIAGNOSIS — G4730 Sleep apnea, unspecified: Secondary | ICD-10-CM

## 2015-07-20 DIAGNOSIS — G43111 Migraine with aura, intractable, with status migrainosus: Secondary | ICD-10-CM

## 2015-07-20 DIAGNOSIS — R0683 Snoring: Secondary | ICD-10-CM

## 2015-07-21 NOTE — Sleep Study (Signed)
Please see the scanned sleep study interpretation located in the procedure tab in the chart view section.  

## 2015-08-02 ENCOUNTER — Telehealth: Payer: Self-pay

## 2015-08-02 NOTE — Telephone Encounter (Signed)
Spoke to pt and advised her that her sleep study revealed no organic primary sleep disorder. Hypersomnia remains unexplained. Hypoxemia nor hypercapnia were found and thereby no correlation to headache disorder. I advised an ENT examination if snoring is a concern. I offered an appt with Dr. Brett Fairy to discuss sleep results further, but she declined at this time. She said she would call if she wanted an appt. I advised her to at least continue following up with Dr. Jaynee Eagles regarding her headaches. Pt verbalized understanding.

## 2015-09-14 ENCOUNTER — Encounter: Payer: Self-pay | Admitting: Neurology

## 2015-09-14 MED ORDER — PROPRANOLOL HCL 40 MG PO TABS: 40.0000 mg | ORAL_TABLET | Freq: Two times a day (BID) | ORAL | Status: DC

## 2015-09-14 MED ORDER — RIZATRIPTAN BENZOATE 5 MG PO TBDP: 5.0000 mg | ORAL_TABLET | ORAL | Status: DC | PRN

## 2015-09-14 NOTE — Telephone Encounter (Addendum)
Chart reviewed, patient was seen by Dr. Jaynee Eagles in October 2016 for migraine headaches, tried and failed Topamax, nortriptyline failed to help her headaches  Please call patient, I have added on Inderal 40 mg twice a day as preventive medication, Maxalt as needed

## 2015-09-15 ENCOUNTER — Telehealth: Payer: Self-pay

## 2015-09-15 NOTE — Telephone Encounter (Signed)
Please see email. I spoke to patient and she is aware that Dr. Krista Blue sent in a new prescription for her migraines. (Dr. Jaynee Eagles is out of the office). Patient has picked up Rx and will call if any problems.

## 2015-10-06 ENCOUNTER — Ambulatory Visit: Payer: Medicaid Other | Admitting: Neurology

## 2015-10-26 ENCOUNTER — Ambulatory Visit (INDEPENDENT_AMBULATORY_CARE_PROVIDER_SITE_OTHER): Payer: Medicaid Other | Admitting: Neurology

## 2015-10-26 ENCOUNTER — Encounter: Payer: Self-pay | Admitting: Neurology

## 2015-10-26 VITALS — BP 150/103 | HR 115 | Ht 66.0 in | Wt 239.4 lb

## 2015-10-26 DIAGNOSIS — G43709 Chronic migraine without aura, not intractable, without status migrainosus: Secondary | ICD-10-CM

## 2015-10-26 MED ORDER — NAPROXEN 500 MG PO TABS: 500.0000 mg | ORAL_TABLET | Freq: Two times a day (BID) | ORAL | Status: DC | PRN

## 2015-10-26 MED ORDER — NORTRIPTYLINE HCL 75 MG PO CAPS: 75.0000 mg | ORAL_CAPSULE | Freq: Every day | ORAL | Status: DC

## 2015-10-26 NOTE — Patient Instructions (Signed)
Remember to drink plenty of fluid, eat healthy meals and do not skip any meals. Try to eat protein with a every meal and eat a healthy snack such as fruit or nuts in between meals. Try to keep a regular sleep-wake schedule and try to exercise daily, particularly in the form of walking, 20-30 minutes a day, if you can.   As far as your medications are concerned, I would like to suggest:  Nortriptyline 75mg  at night Naproxen 500mg  twice daily. Start 2 days before period and take for 5 days, take with food, stop for dark stools or GI upset Botox for migraine Continue the maxalt  I would like to see you back for botox, sooner if we need to. Please call us with any interim questions, concerns, problems, updates or refill requests.   Our phone number is [phone]. We also have an after hours call service for urgent matters and there is a physician on-call for urgent questions. For any emergencies you know to call 911 or go to the nearest emergency room

## 2015-10-26 NOTE — Progress Notes (Signed)
[facility]   Interval history 2/7/017: Patient with migraines. She had side effects to Topamax. Propranolol with side effects. She is on nortriptyline and Inderal currently. Sleep study negative for OSA or hypercapnia/hypersomnia.  She has headaches 10x a month that are migrainous with at least 15/30 monthly headache days. Take the maxalt which helps. Worse with her period. No daily medication overuse. The Nortriptyline is helping her sleep. No side effects to the nortriptyline. Discussed botox. Discussed nortriptyline. She takes maxalt and can be right away and can be an hour when it helps. No medication overuse. Migraines are more on the left but bilateraly in the forehead and it behind both eyes. She endorses light sensitivity, sound sensitivity, nausea and vomiting, she needs a dark room and to sit completely still, no aura. Can be 10/10 pain.  Interval History: She started the Topamax a month into it. She was having side effects to the Topamax and the imitrex. The imitrex made her feel like she she was choking. The topamax made her feel dizzy and headaches didn;t get better. She is having the headaches constantly for 8 days and then gets a break and then they restart. She snores, she wake up with headaches, she woke up with one at 4am and she can't think straight. Excessively tired during the day, she can take a nap anytime, she gained weight recently. In September she had 20 headache days and in October so far she has 10 headache days. She has insomnia very often. Sounds are triggers and she gets headaches when she is working out.   HPI: Kelly Marsh is a 40 y.o. female here as a referral from Dr. Ronnald Ramp for headaches.   Headaches started at the age of 10. No headaches in the family. No inciting factors, no trauma. Headaches are getting worse recently. They are more on the left but bilateraly in the forehead and it behind both eyes. She endorses light sensitivity, sound sensitivity  which worsens headache. Feels throbbing. No aura. She has a continuous headache. Worse in the afternoon. She wakes up with them. She is excessively tired. She is taking Soma and tylenol 3. She uses zofran for nausea. She has nausea, she has vomited. She gets little bubbles before the headache starts. She uses cold rags which help. She can get to 10/10 pain. She has blurry vision with the headaches and lasts for days. Her head gets full, ears are full. Her scalp gets sensitive. She sometimes has to go into a dark room which helps. She always has a low-level headache 3-4/10 daily. Last time she had eyes checked was a while ago. She has had 5 kids, not having anymore and uses birth control.   Reviewed notes, labs and imaging from outside physicians, which showed:  CT head 06/2014: personally reviewed images Skull and Sinuses:Negative for fracture or destructive process. The mastoids, middle ears, and imaged paranasal sinuses are clear.  Orbits: No acute abnormality.  Brain: No evidence of acute abnormality, such as acute infarction, hemorrhage, hydrocephalus, or mass lesion/mass effect. Globus pallidus calcification is prominent for age but not strictly pathologic.  IMPRESSION: No acute intracranial disease.  Review of Systems: Patient complains of symptoms per HPI as well as the following symptoms: Headache. No CP, no SOB. Pertinent negatives per HPI. All others negative.   Social History   Social History  . Marital Status: Married    Spouse Name: Teigan Carver   . Number of Children: 5  . Years of Education: 10  Occupational History  . Not on file.   Social History Main Topics  . Smoking status: Never Smoker   . Smokeless tobacco: Not on file  . Alcohol Use: No  . Drug Use: No  . Sexual Activity: Not on file   Other Topics Concern  . Not on file   Social History Narrative   Lives at home with husband and family   Caffeine use: 1 cup coffee per week       Family History   Problem Relation Age of Onset  . Diabetes Mother   . Cancer Maternal Grandmother   . Cancer Maternal Grandfather   . Migraines Neg Hx     Past Medical History  Diagnosis Date  . Migraine   . Snoring     Past Surgical History  Procedure Laterality Date  . No past surgeries      Current Outpatient Prescriptions  Medication Sig Dispense Refill  . CAZIANT 0.1/0.125/0.15 -0.025 MG tablet TK 1 T PO D  11  . ondansetron (ZOFRAN) 8 MG tablet Take 8 mg by mouth as needed.  0  . propranolol (INDERAL) 40 MG tablet Take 1 tablet (40 mg total) by mouth 2 (two) times daily. 60 tablet 11  . rizatriptan (MAXALT-MLT) 5 MG disintegrating tablet Take 1 tablet (5 mg total) by mouth as needed. May repeat in 2 hours if needed 15 tablet 6  . naproxen (NAPROSYN) 500 MG tablet Take 1 tablet (500 mg total) by mouth every 12 (twelve) hours as needed. 10 tablet 11  . nortriptyline (PAMELOR) 75 MG capsule Take 1 capsule (75 mg total) by mouth at bedtime. 30 capsule 11   No current facility-administered medications for this visit.    Allergies as of 10/26/2015  . (No Known Allergies)    Vitals: BP 150/103 mmHg  Pulse 115  Ht 5\' 6"  (1.676 m)  Wt 239 lb 6.4 oz (108.591 kg)  BMI 38.66 kg/m2 Last Weight:  Wt Readings from Last 1 Encounters:  10/26/15 239 lb 6.4 oz (108.591 kg)   Last Height:   Ht Readings from Last 1 Encounters:  10/26/15 5\' 6"  (1.676 m)   Neuro: Detailed Neurologic Exam  Speech:  Speech is normal; fluent and spontaneous with normal comprehension.  Cognition:  The patient is oriented to person, place, and time;   recent and remote memory intact;   language fluent;   normal attention, concentration,   fund of knowledge Cranial Nerves:  The pupils are equal, round, and reactive to light. The fundi are normal and spontaneous venous pulsations are present. Visual fields are full to finger confrontation. Extraocular movements are intact. Trigeminal sensation  is intact and the muscles of mastication are normal. The face is symmetric. The palate elevates in the midline. Hearing intact. Voice is normal. Shoulder shrug is normal. The tongue has normal motion without fasciculations.   Coordination:  Normal finger to nose and heel to shin. Normal rapid alternating movements.   Gait:  Heel-toe and tandem gait are normal.   Motor Observation:  No asymmetry, no atrophy, and no involuntary movements noted. Tone:  Normal muscle tone.   Posture:  Posture is normal. normal erect   Strength:  Strength is V/V in the upper and lower limbs.    Sensation: intact to LT   Reflex Exam:  DTR's:  Deep tendon reflexes in the upper and lower extremities are normal bilaterally.  Toes:  The toes are downgoing bilaterally.  Clonus:  Clonus is absent.  Assessment/Plan: 40 year old female with chronic migraines.   Increase nortriptyline at night. Continue propramolol. botox for migraine - discussed extensively, risks and benefits, procedure, side effects Naproxen 500mg  twice daily for 2 days before migraines for 5 days, for migraine exacerbation due to menses.  To prevent or relieve headaches, try the following: Cool Compress. Lie down and place a cool compress on your head.  Avoid headache triggers. If certain foods or odors seem to have triggered your migraines in the past, avoid them. A headache diary might help you identify triggers.  Include physical activity in your daily routine. Try a daily walk or other moderate aerobic exercise.  Manage stress. Find healthy ways to cope with the stressors, such as delegating tasks on your to-do list.  Practice relaxation techniques. Try deep breathing, yoga, massage and visualization.  Eat regularly. Eating regularly scheduled meals and maintaining a healthy diet might help prevent headaches. Also, drink plenty of fluids.  Follow a regular sleep schedule. Sleep deprivation  might contribute to headaches Consider biofeedback. With this mind-body technique, you learn to control certain bodily functions - such as muscle tension, heart rate and blood pressure - to prevent headaches or reduce headache pain.    Proceed to emergency room if you experience new or worsening symptoms or symptoms do not resolve, if you have new neurologic symptoms or if headache is severe, or for any concerning symptom.    Sarina Ill, MD  [facility] [address] [place], [place] [address]  Phone [phone] Fax [phone]  A total of 45 minutes was spent face-to-face with this patient. Over half this time was spent on counseling patient on the migraines diagnosis and different diagnostic and therapeutic options available.

## 2015-10-27 ENCOUNTER — Telehealth: Payer: Self-pay | Admitting: *Deleted

## 2015-10-27 NOTE — Telephone Encounter (Signed)
Called and spoke to pt. Relayed nortriptyline not covered by her insurance. Advised at her pharmacy it will cost $23.09 for 30 days and $57.19 for 90 days.  [facility]: 30 days-$12.08 and 90 days- $33.84  [facility]: amytriptyline 75mg  tablets: 30 days- $4.00 and 90 days-$10.00  Pt stated she picked up rx at her pharmacy today and it only cost her $3.00.

## 2015-11-09 ENCOUNTER — Telehealth: Payer: Self-pay | Admitting: Neurology

## 2015-11-09 NOTE — Telephone Encounter (Signed)
[place] with [facility] [phone] ext [phone]  [phone] called [facility] need botox RX re faxed with prescribers information. [facility] the RX that was faxed had everything except the prescribers information.

## 2015-11-10 NOTE — Telephone Encounter (Signed)
Called back and spoke with her. Will fax over Rx.

## 2015-11-10 NOTE — Telephone Encounter (Signed)
Kelly Marsh called back inquiring if RX was faxed. She will be available today until 5pm central time.

## 2015-11-15 ENCOUNTER — Telehealth: Payer: Self-pay | Admitting: Neurology

## 2015-11-15 NOTE — Telephone Encounter (Signed)
[facility] called to schedule botox delivery, please call [phone].

## 2015-11-15 NOTE — Telephone Encounter (Signed)
Called patient to let her know the pharmacy needed to speak with her regarding some questions. Gave her their phone number and she stated she would call.

## 2015-11-17 NOTE — Telephone Encounter (Signed)
Delivery has been scheduled for today.

## 2015-11-18 ENCOUNTER — Ambulatory Visit (INDEPENDENT_AMBULATORY_CARE_PROVIDER_SITE_OTHER): Payer: Medicaid Other | Admitting: Neurology

## 2015-11-18 ENCOUNTER — Encounter: Payer: Self-pay | Admitting: Neurology

## 2015-11-18 VITALS — BP 148/103 | HR 92 | Ht 66.0 in | Wt 239.6 lb

## 2015-11-18 DIAGNOSIS — G43709 Chronic migraine without aura, not intractable, without status migrainosus: Secondary | ICD-10-CM | POA: Diagnosis not present

## 2015-11-18 NOTE — Progress Notes (Signed)
Botox-100unitsx2 vials Lot: [id number] Expiration: 05/2018 [id number]  0.9% Sodium Chloride- 44mL total [id number] Expiration: 06/2016 NDC: [id number]

## 2015-11-18 NOTE — Progress Notes (Signed)
History:   Interval history 2/7/017: Patient with migraines. She had side effects to Topamax. Propranolol with side effects. She is on nortriptyline and Inderal currently. Sleep study negative for OSA or hypercapnia/hypersomnia. She has headaches 10x a month that are migrainous with at least 15/30 monthly headache days. Take the maxalt which helps. Worse with her period. No daily medication overuse. The Nortriptyline is helping her sleep. No side effects to the nortriptyline. Discussed botox. Discussed nortriptyline. She takes maxalt and can be right away and can be an hour when it helps. No medication overuse. Migraines are more on the left but bilateraly in the forehead and it behind both eyes. She endorses light sensitivity, sound sensitivity, nausea and vomiting, she needs a dark room and to sit completely still, no aura. Can be 10/10 pain.  Interval History: She started the Topamax a month into it. She was having side effects to the Topamax and the imitrex. The imitrex made her feel like she she was choking. The topamax made her feel dizzy and headaches didn;t get better. She is having the headaches constantly for 8 days and then gets a break and then they restart. She snores, she wake up with headaches, she woke up with one at 4am and she can't think straight. Excessively tired during the day, she can take a nap anytime, she gained weight recently. In September she had 20 headache days and in October so far she has 10 headache days. She has insomnia very often. Sounds are triggers and she gets headaches when she is working out.   HPI: Kelly Marsh is a 40 y.o. female here as a referral from Dr. Ronnald Ramp for headaches.   Headaches started at the age of 18. No headaches in the family. No inciting factors, no trauma. Headaches are getting worse recently. They are more on the left but bilateraly in the forehead and it behind both eyes. She endorses light sensitivity, sound sensitivity which worsens  headache. Feels throbbing. No aura. She has a continuous headache. Worse in the afternoon. She wakes up with them. She is excessively tired. She is taking Soma and tylenol 3. She uses zofran for nausea. She has nausea, she has vomited. She gets little bubbles before the headache starts. She uses cold rags which help. She can get to 10/10 pain. She has blurry vision with the headaches and lasts for days. Her head gets full, ears are full. Her scalp gets sensitive. She sometimes has to go into a dark room which helps. She always has a low-level headache 3-4/10 daily. Last time she had eyes checked was a while ago. She has had 5 kids, not having anymore and uses birth control.   Reviewed notes, labs and imaging from outside physicians, which showed:  CT head 06/2014: personally reviewed images Skull and Sinuses:Negative for fracture or destructive process. The mastoids, middle ears, and imaged paranasal sinuses are clear.  Orbits: No acute abnormality.  Brain: No evidence of acute abnormality, such as acute infarction, hemorrhage, hydrocephalus, or mass lesion/mass effect. Globus pallidus calcification is prominent for age but not strictly pathologic.  IMPRESSION: No acute intracranial disease.  Assessment/Plan: 40 year old female with chronic migraines.   Increase nortriptyline at night. Continue propramolol. botox for migraine - discussed extensively, risks and benefits, procedure, side effects Naproxen 500mg  twice daily for 2 days before migraines for 5 days, for migraine exacerbation due to menses.   Consent Form Botulism Toxin Injection For Chronic Migraine  Botulism toxin has been approved  by the Federal drug administration for treatment of chronic migraine. Botulism toxin does not cure chronic migraine and it may not be effective in some patients.  The administration of botulism toxin is accomplished by injecting a small amount of toxin into the muscles of the neck and head.  Dosage must be titrated for each individual. Any benefits resulting from botulism toxin tend to wear off after 3 months with a repeat injection required if benefit is to be maintained. Injections are usually done every 3-4 months with maximum effect peak achieved by about 2 or 3 weeks. Botulism toxin is expensive and you should be sure of what costs you will incur resulting from the injection.  The side effects of botulism toxin use for chronic migraine may include:   -Transient, and usually mild, facial weakness with facial injections  -Transient, and usually mild, head or neck weakness with head/neck injections  -Reduction or loss of forehead facial animation due to forehead muscle              weakness  -Eyelid drooping  -Dry eye  -Pain at the site of injection or bruising at the site of injection  -Double vision  -Potential unknown long term risks  Contraindications: You should not have Botox if you are pregnant, nursing, allergic to albumin, have an infection, skin condition, or muscle weakness at the site of the injection, or have myasthenia gravis, Lambert-Eaton syndrome, or ALS.  It is also possible that as with any injection, there may be an allergic reaction or no effect from the medication. Reduced effectiveness after repeated injections is sometimes seen and rarely infection at the injection site may occur. All care will be taken to prevent these side effects. If therapy is given over a long time, atrophy and wasting in the muscle injected may occur. Occasionally the patient's become refractory to treatment because they develop antibodies to the toxin. In this event, therapy needs to be modified.  I have read the above information and consent to the administration of botulism toxin.  On file  ______________  _____   _________________  Patient signature     Date   Witness signature       BOTOX PROCEDURE NOTE FOR MIGRAINE HEADACHE    Contraindications and precautions  discussed with patient(above). Aseptic procedure was observed and patient tolerated procedure. Procedure performed by Dr. Georgia Dom  The condition has existed for more than 6 months, and pt does not have a diagnosis of ALS, Myasthenia Gravis or Lambert-Eaton Syndrome. Risks and benefits of injections discussed and pt agrees to proceed with the procedure. Written consent obtained  These injections are medically necessary. He receives good benefits from these injections. These injections do not cause sedations or hallucinations which the oral therapies may cause.  Indication/Diagnosis: chronic migraine BOTOX(J0585) injection was performed according to protocol by [employer]. 200 units of BOTOX was dissolved into 4 cc NS.  NDC: [id number]  Botox-100unitsx2 vials  Lot: [id number]  Expiration: 05/2018  [id number]  0.9% Sodium Chloride- 43mL total  [id number]  Expiration: 06/2016  NDC: [id number]      Description of procedure:  The patient was placed in a sitting position. The standard protocol was used for Botox as follows, with 5 units of Botox injected at each site:   -Procerus muscle, midline injection  -Corrugator muscle, bilateral injection  -Frontalis muscle, bilateral injection, with 2 sites each side, medial injection was performed in the upper one third of the frontalis muscle, in the region vertical from  the medial inferior edge of the superior orbital rim. The lateral injection was again in the upper one third of the forehead vertically above the lateral limbus of the cornea, 1.5 cm lateral to the medial injection site.  -Temporalis muscle injection, 4 sites, bilaterally. The first injection was 3 cm above the tragus of the ear, second injection site was 1.5 cm to 3 cm up from the first injection site in line with the tragus of the ear. The third injection site was 1.5-3 cm forward between the first 2 injection sites. The fourth injection site was 1.5 cm posterior to the  second injection site.  -Occipitalis muscle injection, 3 sites, bilaterally. The first injection was done one half way between the occipital protuberance and the tip of the mastoid process behind the ear. The second injection site was done lateral and superior to the first, 1 fingerbreadth from the first injection. The third injection site was 1 fingerbreadth superiorly and medially from the first injection site.  -Cervical paraspinal muscle injection, 2 sites, bilateral knee first injection site was 1 cm from the midline of the cervical spine, 3 cm inferior to the lower border of the occipital protuberance. The second injection site was 1.5 cm superiorly and laterally to the first injection site.  -Trapezius muscle injection was performed at 3 sites, bilaterally. The first injection site was in the upper trapezius muscle halfway between the inflection point of the neck, and the acromion. The second injection site was one half way between the acromion and the first injection site. The third injection was done between the first injection site and the inflection point of the neck.   Will return for repeat injection in 3 months.   A 200 unit sof Botox was used, 155 units were injected, the rest of the Botox was injected into the cervical muscles based on pain response to palpation. The patient tolerated the procedure well, there were no complications of the above procedure.

## 2015-12-24 ENCOUNTER — Telehealth: Payer: Self-pay | Admitting: *Deleted

## 2015-12-24 NOTE — Telephone Encounter (Signed)
Called [facility] for PA on Nortriptyline. Per [facility] rep, nortriptyline medication does not need prior auth. She stated pharmacy needs to submit medication order again , and if they continue to have issue, they will have to call [facility] to resolve. [phone]. Spoke to [employer] and informed of above conversation. She checked into matter, stated it was resolved. Notified patient she should be able to get medication; she verbalized understanding, appreciation.

## 2016-02-24 ENCOUNTER — Ambulatory Visit (INDEPENDENT_AMBULATORY_CARE_PROVIDER_SITE_OTHER): Payer: Medicaid Other | Admitting: Neurology

## 2016-02-24 VITALS — BP 137/105 | HR 78

## 2016-02-24 DIAGNOSIS — G43709 Chronic migraine without aura, not intractable, without status migrainosus: Secondary | ICD-10-CM | POA: Diagnosis not present

## 2016-02-24 NOTE — Progress Notes (Signed)
Consent Form Botulism Toxin Injection For Chronic Migraine  Botulism toxin has been approved by the Federal drug administration for treatment of chronic migraine. Botulism toxin does not cure chronic migraine and it may not be effective in some patients. Discussed at every procedure appointment:  The administration of botulism toxin is accomplished by injecting a small amount of toxin into the muscles of the neck and head. Dosage must be titrated for each individual. Any benefits resulting from botulism toxin tend to wear off after 3 months with a repeat injection required if benefit is to be maintained. Injections are usually done every 3-4 months with maximum effect peak achieved by about 2 or 3 weeks. Botulism toxin is expensive and you should be sure of what costs you will incur resulting from the injection.  The side effects of botulism toxin use for chronic migraine may include:   -Transient, and usually mild, facial weakness with facial injections  -Transient, and usually mild, head or neck weakness with head/neck injections  -Reduction or loss of forehead facial animation due to forehead muscle              weakness  -Eyelid drooping  -Dry eye  -Pain at the site of injection or bruising at the site of injection  -Double vision  -Potential unknown long term risks  Contraindications: You should not have Botox if you are pregnant, nursing, allergic to albumin, have an infection, skin condition, or muscle weakness at the site of the injection, or have myasthenia gravis, Lambert-Eaton syndrome, or ALS.  It is also possible that as with any injection, there may be an allergic reaction or no effect from the medication. Reduced effectiveness after repeated injections is sometimes seen and rarely infection at the injection site may occur. All care will be taken to prevent these side effects. If therapy is given over a long time, atrophy and wasting in the muscle injected may occur.  Occasionally the patient's become refractory to treatment because they develop antibodies to the toxin. In this event, therapy needs to be modified.  Patient acknowledged.    BOTOX PROCEDURE NOTE FOR MIGRAINE HEADACHE    Contraindications and precautions discussed with patient(above). Aseptic procedure was observed and patient tolerated procedure. Procedure performed by Dr. Georgia Dom  The condition has existed for more than 6 months, and pt does not have a diagnosis of ALS, Myasthenia Gravis or Lambert-Eaton Syndrome. Risks and benefits of injections discussed and pt agrees to proceed with the procedure. Written consent obtained  These injections are medically necessary. He receives good benefits from these injections. These injections do not cause sedations or hallucinations which the oral therapies may cause.  Indication/Diagnosis: chronic migraine BOTOX(J0585) injection was performed according to protocol by [employer]. 200 units of BOTOX was dissolved into 4 cc NS.  NDC: [id number]  Type of toxin: Botox  Lot # [id number] 100 units x 2 bottles EXP: 07/2018   Description of procedure:  The patient was placed in a sitting position. The standard protocol was used for Botox as follows, with 5 units of Botox injected at each site unless otherwise specified:   -Procerus muscle, midline injection  -Corrugator muscle, bilateral injection  -Frontalis muscle, bilateral injection, with 2 sites each side, medial injection was performed in the upper one third of the frontalis muscle, in the region vertical from the medial inferior edge of the superior orbital rim. The lateral injection was again in the upper one third of the forehead vertically above the lateral  limbus of the cornea, 1.5 cm lateral to the medial injection site.  -Temporalis muscle injection, 4 sites, bilaterally. The first injection was 3 cm above the tragus of the ear, second injection site was 1.5 cm to 3 cm up from the  first injection site in line with the tragus of the ear. The third injection site was 1.5-3 cm forward between the first 2 injection sites. The fourth injection site was 1.5 cm posterior to the second injection site. 4 additional injections were provided in the left temporalis muscle.  -Occipitalis muscle injection, 3 sites, bilaterally. The first injection was done one half way between the occipital protuberance and the tip of the mastoid process behind the ear. The second injection site was done lateral and superior to the first, 1 fingerbreadth from the first injection. The third injection site was 1 fingerbreadth superiorly and medially from the first injection site.  -Cervical paraspinal muscle injection, 2 sites, bilaterally first injection site was 1 cm from the midline of the cervical spine, 3 cm inferior to the lower border of the occipital protuberance. The second injection site was 1.5 cm superiorly and laterally to the first injection site. 4 additional injections were provided in the left cervical paraspinal and trapezius muscles. .   -Trapezius muscle injection was performed at 3 sites, bilaterally. The first injection site was in the upper trapezius muscle halfway between the inflection point of the neck, and the acromion. The second injection site was one half way between the acromion and the first injection site. The third injection was done between the first injection site and the inflection point of the neck.   Will return for repeat injection in 3 months.   A 200 unit sof Botox was used, 195 units were injected, the rest of the Botox was wasted(5 units). The patient tolerated the procedure well, there were no complications of the above procedure.

## 2016-02-26 ENCOUNTER — Encounter: Payer: Self-pay | Admitting: Neurology

## 2016-03-27 ENCOUNTER — Encounter: Payer: Self-pay | Admitting: Neurology

## 2016-03-28 ENCOUNTER — Other Ambulatory Visit: Payer: Self-pay | Admitting: Neurology

## 2016-03-28 MED ORDER — DIHYDROERGOTAMINE MESYLATE 4 MG/ML NA SOLN: 1.0000 | NASAL | Status: DC | PRN

## 2016-03-30 ENCOUNTER — Ambulatory Visit (INDEPENDENT_AMBULATORY_CARE_PROVIDER_SITE_OTHER): Payer: Medicaid Other | Admitting: Neurology

## 2016-03-30 VITALS — BP 118/85 | HR 74 | Ht 66.0 in | Wt 225.0 lb

## 2016-03-30 DIAGNOSIS — G43709 Chronic migraine without aura, not intractable, without status migrainosus: Secondary | ICD-10-CM | POA: Diagnosis not present

## 2016-03-30 MED ORDER — ONDANSETRON HCL 8 MG PO TABS: 8.0000 mg | ORAL_TABLET | ORAL | Status: DC | PRN

## 2016-03-30 MED ORDER — ELETRIPTAN HYDROBROMIDE 40 MG PO TABS: 40.0000 mg | ORAL_TABLET | Freq: Once | ORAL | Status: DC

## 2016-03-30 NOTE — Patient Instructions (Addendum)
Overall you are doing fairly well but I do want to suggest a few things today:   Remember to drink plenty of fluid, eat healthy meals and do not skip any meals. Try to eat protein with a every meal and eat a healthy snack such as fruit or nuts in between meals. Try to keep a regular sleep-wake schedule and try to exercise daily, particularly in the form of walking, 20-30 minutes a day, if you can.   As far as your medications are concerned, I would like to suggest: At the onset of migraine, take migranal and zofran under the tongue. Do not take with maxalt or relpax.   At the onset of migraine, may also try Relpax. Take with Zofran. May repeat in 2 hours once.   Our phone number is [phone]. We also have an after hours call service for urgent matters and there is a physician on-call for urgent questions. For any emergencies you know to call 911 or go to the nearest emergency room  Dihydroergotamine nasal spray What is this medicine? DIHYDROERGOTAMINE (dye hye droe er GOT a meen) is part of a group of medicines called ergot alkaloids. It is used to treat migraine headaches with or without aura. It should not be used to prevent migraine headaches. This medicine may be used for other purposes; ask your health care provider or pharmacist if you have questions. What should I tell my health care provider before I take this medicine? They need to know if you have any of these conditions: -chest pain or difficulty breathing -heart or blood vessel disease -high blood pressure -infection -kidney disease -liver disease -poor circulation -risk factors for heart disease such as smoking, high cholesterol, a family history of heart disease, or if you are postmenopausal or a female over 41 years of age -an unusual or allergic reaction to dihydroergotamine, ergot alkaloids, other medicines, foods, dyes, or preservatives -pregnant or trying to get pregnant -breast-feeding How should I use this  medicine? This medicine is for use in the nose. Follow the directions on the prescription label. This medicine is given at the first symptoms of a migraine. It is not for everyday use. You must prepare the nasal spray only when you are ready to use it. Follow the instructions that come with your prescription or contact your doctor or health care professional if you are unsure how to do this. Throw away the sprayer after completing the full dose. Each unit is only good for eight hours once opened. Do not use this medicine more often than directed. Talk to your pediatrician regarding the use of this medicine in children. Special care may be needed. Overdosage: If you think you have taken too much of this medicine contact a poison control center or emergency room at once. NOTE: This medicine is only for you. Do not share this medicine with others. What if I miss a dose? This does not apply; this medicine is not for regular use. What may interact with this medicine? Do not take this medicine with any of the following medications: -antifungal drugs like fluconazole, itraconazole, ketoconazole or voriconazole -certain antibiotics like erythromycin, clarithromycin, and troleandomycin -cocaine -conivaptan -dexfenfluramine -ephedrine -feverfew -grapefruit juice -imatinib -isoproterenol -medicines called nitrates like isosorbide and nitroglycerin -medicines for colds, flu, or breathing difficulties like phenylephrine and pseudoephedrine -medicines for migraine headache like almotriptan, eletriptan, frovatriptan, naratriptan, rizatriptan, sumatriptan, and zolmitriptan -midodrine -nefazodone -other ergot alkaloids like bromocriptine, cabergoline, dihydroergotamine, ergoloid mesylates, ergonovine, methylergonovine, and methysergide -some medicines for HIV  This medicine may also interact with the following medications: -clotrimazole -fluoxetine -fluvoxamine -medicines for high blood pressure,  especially beta-blockers -metronidazole -nicotine -zileuton This list may not describe all possible interactions. Give your health care provider a list of all the medicines, herbs, non-prescription drugs, or dietary supplements you use. Also tell them if you smoke, drink alcohol, or use illegal drugs. Some items may interact with your medicine. What should I watch for while using this medicine? Check with your doctor or health care professional if you do not get relief from your headaches after using this medicine. You may need to be changed to a different kind of medicine to treat your migraines. You may get drowsy or dizzy. Do not drive, use machinery, or do anything that needs mental alertness until you know how this medicine affects you. To reduce dizzy or fainting spells, do not sit or stand up quickly, especially if you are an older patient. Alcohol can increase drowsiness, dizziness and flushing. Avoid alcoholic drinks. This medicine decreases the circulation of blood to your skin, fingers, and toes. You may get more sensitive to the cold. Elderly patients are more likely to feel this effect. Dress warmly and avoid long exposure to the cold. What side effects may I notice from receiving this medicine? Side effects that you should report to your doctor or health care professional as soon as possible: -allergic reactions like skin rash, itching or hives, swelling of the face, lips, or tongue -chest pain -cold hands or feet -fast, irregular heartbeat -leg or arm pain, cramps -swelling of hands, ankles, or feet -tingling, pain or numbness in feet or hands -vomiting -weakness in legs Side effects that usually do not require medical attention (report to your doctor or health care professional if they continue or are bothersome): -changes in the taste of food -nasal congestion or sore throat -nausea This list may not describe all possible side effects. Call your doctor for medical advice  about side effects. You may report side effects to FDA at 1-800-FDA-1088. Where should I keep my medicine? Keep out of the reach of children. Store at room temperature below 25 degrees C (77 degrees F). Protect from light, moisture, and heat. Do not refrigerate or freeze. Keep the parts of the nasal spray in the tray provided. Keep this tray loaded in the assembly case. Do not keep an opened nasal spray for more than 8 hours. Throw away any unopened medicine after the expiration date. NOTE: This sheet is a summary. It may not cover all possible information. If you have questions about this medicine, talk to your doctor, pharmacist, or health care provider.    2016, Elsevier/Gold Standard. (2007-12-24 17:17:14)  Eletriptan tablets What is this medicine? ELETRIPTAN (el ih TRIP tan) is used to treat migraines with or without aura. An aura is a strange feeling or visual disturbance that warns you of an attack. It is not used to prevent migraines. This medicine may be used for other purposes; ask your health care provider or pharmacist if you have questions. What should I tell my health care provider before I take this medicine? They need to know if you have any of these conditions: -bowel disease or colitis -diabetes -family history of heart disease -fast or irregular heart beat -heart or blood vessel disease, angina (chest pain), or previous heart attack -high blood pressure -high cholesterol -history of stroke, transient ischemic attacks (TIAs or mini-strokes), or intracranial bleeding -kidney or liver disease -overweight -poor circulation -postmenopausal or surgical removal  of uterus and ovaries -Raynaud's disease -seizure disorder -an unusual or allergic reaction to eletriptan, other medicines, foods, dyes, or preservatives -pregnant or trying to get pregnant -breast-feeding How should I use this medicine? Take this medicine by mouth with a glass of water. Follow the directions on  the prescription label. This medicine is taken at the first symptoms of a migraine. It is not for everyday use. If your migraine headache returns after one dose, you can take another dose as directed. You must leave at least 2 hours between doses, and do not take more than 40 mg as a single dose. Do not take more than 80 mg total in any 24 hour period. If there is no improvement at all after the first dose, do not take a second dose without talking to your doctor or health care professional. Do not take your medicine more often than directed. Talk to your pediatrician regarding the use of this medicine in children. Special care may be needed. Overdosage: If you think you have taken too much of this medicine contact a poison control center or emergency room at once. NOTE: This medicine is only for you. Do not share this medicine with others. What if I miss a dose? This does not apply; this medicine is not for regular use. What may interact with this medicine? Do not take this medicine with any of the following medications: -amiodarone -amphetamine, dextroamphetamine, or cocaine -aprepitant -certain antibiotics like clarithromycin, erythromycin, troleandomycin -cimetidine -conivaptan -dalfopristin; quinupristin -dihydroergotamine, ergotamine, ergoloid mesylates, methysergide, or ergot-type medication - do not take within 24 hours of taking eletriptan. -diltiazem -feverfew -imatinib -medicines for fungal infections like fluconazole, itraconazole, ketoconazole, and voriconazole -medicines for HIV, AIDS -medicines for mental depression like fluvoxamine and nefazodone -mifepristone -other migraine medicines like almotriptan, sumatriptan, naratriptan, rizatriptan, zolmitriptan - do not take within 24 hours of taking eletriptan. -tryptophan -verapamil This medicine may also interact with the following medications: -medicines for mental depression, anxiety or mood problems This list may not  describe all possible interactions. Give your health care provider a list of all the medicines, herbs, non-prescription drugs, or dietary supplements you use. Also tell them if you smoke, drink alcohol, or use illegal drugs. Some items may interact with your medicine. What should I watch for while using this medicine? Only take this medicine for a migraine headache. Take it if you get warning symptoms or at the start of a migraine attack. It is not for regular use to prevent migraine attacks. You may get drowsy or dizzy. Do not drive, use machinery, or do anything that needs mental alertness until you know how this medicine affects you. To reduce dizzy or fainting spells, do not sit or stand up quickly, especially if you are an older patient. Alcohol can increase drowsiness, dizziness and flushing. Avoid alcoholic drinks. Smoking cigarettes may increase the risk of heart-related side effects from using this medicine. If you take migraine medicines for 10 or more days a month, your migraines may get worse. Keep a diary of headache days and medicine use. Contact your healthcare professional if your migraine attacks occur more frequently. What side effects may I notice from receiving this medicine? Side effects that you should report to your doctor or health care professional as soon as possible: -allergic reactions like skin rash, itching or hives, swelling of the face, lips, or tongue -fast, slow, or irregular heart beat -increased or decreased blood pressure -seizures -severe stomach pain and cramping, bloody diarrhea -signs and symptoms of a  blood clot such as breathing problems; changes in vision; chest pain; severe, sudden headache; pain, swelling, warmth in the leg; trouble speaking; sudden numbness or weakness of the face, arm or leg -tingling, pain, or numbness in the face, hands, or feet Side effects that usually do not require medical attention (report to your doctor or health care  professional if they continue or are bothersome): -drowsiness -feeling warm, flushing, or redness of the face -headache -muscle cramps, pain -nausea, vomiting -unusually weak or tired This list may not describe all possible side effects. Call your doctor for medical advice about side effects. You may report side effects to FDA at 1-800-FDA-1088. Where should I keep my medicine? Keep out of the reach of children. Store at room temperature between 15 and 30 degrees C (59 and 86 degrees F). Throw away any unused medicine after the expiration date. NOTE: This sheet is a summary. It may not cover all possible information. If you have questions about this medicine, talk to your doctor, pharmacist, or health care provider.    2016, Elsevier/Gold Standard. (2013-05-06 10:22:32)

## 2016-03-30 NOTE — Progress Notes (Signed)
[facility]    Provider:  Dr Jaynee Eagles Referring Provider: No ref. provider found Primary Care Physician:  No primary care provider on file.  Interval History 03/30/2016:  Tried: Topamax(side effects), propranolol(side effects), Nortriptyline(currently on and helping), Inderal(currently on and helping), maxalt(didn't help), zofran(haven't taken it in a while - reorderd), Imitrex(didn't work). She had only 1-2 migraines in the last month. These migraines lasted a few days. Felt nausea and shakiness, dizziness, ears hurt.   Discussed trying migranal, she brings one in todaym showed her how to set it up and use it.   Interval history 2/7/017: Patient with migraines. She had side effects to Topamax. Propranolol with side effects. She is on nortriptyline and Inderal currently. Sleep study negative for OSA or hypercapnia/hypersomnia. She has headaches 10x a month that are migrainous with at least 15/30 monthly headache days. Take the maxalt which helps. Worse with her period. No daily medication overuse. The Nortriptyline is helping her sleep. No side effects to the nortriptyline. Discussed botox. Discussed nortriptyline. She takes maxalt and can be right away and can be an hour when it helps. No medication overuse. Migraines are more on the left but bilateraly in the forehead and it behind both eyes. She endorses light sensitivity, sound sensitivity, nausea and vomiting, she needs a dark room and to sit completely still, no aura. Can be 10/10 pain.  Interval History: She started the Topamax a month into it. She was having side effects to the Topamax and the imitrex. The imitrex made her feel like she she was choking. The topamax made her feel dizzy and headaches didn;t get better. She is having the headaches constantly for 8 days and then gets a break and then they restart. She snores, she wake up with headaches, she woke up with one at 4am and she can't think straight. Excessively tired  during the day, she can take a nap anytime, she gained weight recently. In September she had 20 headache days and in October so far she has 10 headache days. She has insomnia very often. Sounds are triggers and she gets headaches when she is working out.   HPI: Kelly Marsh is a 40 y.o. female here as a referral from Dr. Ronnald Marsh for headaches.   Headaches started at the age of 28. No headaches in the family. No inciting factors, no trauma. Headaches are getting worse recently. They are more on the left but bilateraly in the forehead and it behind both eyes. She endorses light sensitivity, sound sensitivity which worsens headache. Feels throbbing. No aura. She has a continuous headache. Worse in the afternoon. She wakes up with them. She is excessively tired. She is taking Soma and tylenol 3. She uses zofran for nausea. She has nausea, she has vomited. She gets little bubbles before the headache starts. She uses cold rags which help. She can get to 10/10 pain. She has blurry vision with the headaches and lasts for days. Her head gets full, ears are full. Her scalp gets sensitive. She sometimes has to go into a dark room which helps. She always has a low-level headache 3-4/10 daily. Last time she had eyes checked was a while ago. She has had 5 kids, not having anymore and uses birth control.   Reviewed notes, labs and imaging from outside physicians, which showed:  CT head 06/2014: personally reviewed images Skull and Sinuses:Negative for fracture or destructive process. The mastoids, middle ears, and imaged paranasal sinuses are clear.  Orbits: No acute abnormality.  Brain:  No evidence of acute abnormality, such as acute infarction, hemorrhage, hydrocephalus, or mass lesion/mass effect. Globus pallidus calcification is prominent for age but not strictly pathologic.   Social History   Social History  . Marital Status: Married    [name]   . Number of Children: 5  . Years of  Education: 10   Occupational History  . Not on file.   Social History Main Topics  . Smoking status: Never Smoker   . Smokeless tobacco: Not on file  . Alcohol Use: No  . Drug Use: No  . Sexual Activity: Not on file   Other Topics Concern  . Not on file   Social History Narrative   Lives at home with husband and family   Caffeine use: 1 cup coffee per week       Family History  Problem Relation Age of Onset  . Diabetes Mother   . Cancer Maternal Grandmother   . Cancer Maternal Grandfather   . Migraines Neg Hx     Past Medical History  Diagnosis Date  . Migraine   . Snoring     Past Surgical History  Procedure Laterality Date  . No past surgeries      Current Outpatient Prescriptions  Medication Sig Dispense Refill  . CAZIANT 0.1/0.125/0.15 -0.025 MG tablet TK 1 T PO D  11  . dihydroergotamine (MIGRANAL) 4 MG/ML nasal spray Place 1 spray into the nose as needed for migraine. Use in one nostril as directed.  No more than 4 sprays in one hour 8 mL 12  . eletriptan (RELPAX) 40 MG tablet Take 1 tablet (40 mg total) by mouth once. May repeat in 2 hours if headache persists or recurs. 10 tablet 11  . nortriptyline (PAMELOR) 75 MG capsule Take 1 capsule (75 mg total) by mouth at bedtime. 30 capsule 11  . ondansetron (ZOFRAN) 8 MG tablet Take 1 tablet (8 mg total) by mouth as needed for nausea or vomiting. Take for migraine as well. 30 tablet 12   No current facility-administered medications for this visit.    Allergies as of 03/30/2016  . (No Known Allergies)    Vitals: BP 118/85 mmHg  Pulse 74  Ht 5\' 6"  (1.676 m)  Wt 225 lb (102.059 kg)  BMI 36.33 kg/m2  SpO2 96% Last Weight:  Wt Readings from Last 1 Encounters:  03/30/16 225 lb (102.059 kg)   Last Height:   Ht Readings from Last 1 Encounters:  03/30/16 5\' 6"  (1.676 m)     Speech:  Speech is normal; fluent and spontaneous with normal comprehension.  Cognition:  The patient is oriented to  person, place, and time;   recent and remote memory intact;   language fluent;   normal attention, concentration,   fund of knowledge Cranial Nerves:  The pupils are equal, round, and reactive to light. The fundi are normal and spontaneous venous pulsations are present. Visual fields are full to finger confrontation. Extraocular movements are intact. Trigeminal sensation is intact and the muscles of mastication are normal. The face is symmetric. The palate elevates in the midline. Hearing intact. Voice is normal. Shoulder shrug is normal. The tongue has normal motion without fasciculations.   Coordination:  Normal finger to nose and heel to shin. Normal rapid alternating movements.   Gait:  Heel-toe and tandem gait are normal.   Motor Observation:  No asymmetry, no atrophy, and no involuntary movements noted. Tone:  Normal muscle tone.   Posture:  Posture is normal. normal erect   Strength:  Strength is V/V in the upper and lower limbs.    Sensation: intact to LT   Reflex Exam:  DTR's:  Deep tendon reflexes in the upper and lower extremities are normal bilaterally.  Toes:  The toes are downgoing bilaterally.  Clonus:  Clonus is absent.    Assessment/Plan: 40 year old female with chronic migraines.   Continue nortriptyline at night.  botox for migraine - discussed extensively, risks and benefits, procedure, side effects, due for next injection in a few months Naproxen 500mg  twice daily for 2 days before migraines for 5 days, for migraine exacerbation due to menses. Overall you are doing fairly well but I do want to suggest a few things today:   Remember to drink plenty of fluid, eat healthy meals and do not skip any meals. Try to eat protein with a every meal and eat a healthy snack such as fruit or nuts in between meals. Try to keep a regular sleep-wake schedule and try to exercise daily, particularly in the form of  walking, 20-30 minutes a day, if you can.   As far as your medications are concerned, I would like to suggest: At the onset of migraine, take migranal and zofran under the tongue. Do not take with maxalt or relpax.   At the onset of migraine, may also try Relpax. Take with Zofran. May repeat in 2 hours once.   Our phone number is [phone]. We also have an after hours call service for urgent matters and there is a physician on-call for urgent questions. For any emergencies you know to call 911 or go to the nearest emergency room  Discussed the following: To prevent or relieve headaches, try the following: Cool Compress. Lie down and place a cool compress on your head.  Avoid headache triggers. If certain foods or odors seem to have triggered your migraines in the past, avoid them. A headache diary might help you identify triggers.  Include physical activity in your daily routine. Try a daily walk or other moderate aerobic exercise.  Manage stress. Find healthy ways to cope with the stressors, such as delegating tasks on your to-do list.  Practice relaxation techniques. Try deep breathing, yoga, massage and visualization.  Eat regularly. Eating regularly scheduled meals and maintaining a healthy diet might help prevent headaches. Also, drink plenty of fluids.  Follow a regular sleep schedule. Sleep deprivation might contribute to headaches Consider biofeedback. With this mind-body technique, you learn to control certain bodily functions - such as muscle tension, heart rate and blood pressure - to prevent headaches or reduce headache pain.    Proceed to emergency room if you experience new or worsening symptoms or symptoms do not resolve, if you have new neurologic symptoms or if headache is severe, or for any concerning symptom.   Sarina Ill, MD  [facility] [address] [place], [place] [address]  Phone [phone] Fax [phone]  A total of 30  minutes was spent face-to-face with this patient. Over half this time was spent on counseling patient on the chronic migraine diagnosis and different diagnostic and therapeutic options available.

## 2016-04-04 ENCOUNTER — Telehealth: Payer: Self-pay | Admitting: *Deleted

## 2016-04-04 NOTE — Telephone Encounter (Signed)
Spoke with Jana Half, [place]. Received  PA for Relpax # [id number], approved 04/04/16 thru 03/30/2017; call is Ref #  [id number].

## 2016-04-08 ENCOUNTER — Encounter: Payer: Self-pay | Admitting: Neurology

## 2016-05-31 ENCOUNTER — Telehealth: Payer: Self-pay | Admitting: Neurology

## 2016-05-31 NOTE — Telephone Encounter (Signed)
Faxed in Form for Botox authorization approval to [place] telephone [phone]. Fax [phone].

## 2016-06-01 NOTE — Telephone Encounter (Addendum)
Called Kelly Marsh is still pending. Andee Poles will follow up because I am out of the office on Friday.    Patient has been approved [place] # [id number] 06/01/2017-09/29/2016. Botox .  Botox TBD 09/201/2017 early am buy 8:00 am .

## 2016-06-08 ENCOUNTER — Ambulatory Visit (INDEPENDENT_AMBULATORY_CARE_PROVIDER_SITE_OTHER): Payer: Medicaid Other | Admitting: Neurology

## 2016-06-08 VITALS — BP 129/90 | HR 80 | Ht 66.0 in

## 2016-06-08 DIAGNOSIS — G43709 Chronic migraine without aura, not intractable, without status migrainosus: Secondary | ICD-10-CM

## 2016-06-08 NOTE — Progress Notes (Signed)
Consent Form Botulism Toxin Injection For Chronic Migraine  Botulism toxin has been approved by the Federal drug administration for treatment of chronic migraine. Botulism toxin does not cure chronic migraine and it may not be effective in some patients. Discussed at every procedure appointment:  The administration of botulism toxin is accomplished by injecting a small amount of toxin into the muscles of the neck and head. Dosage must be titrated for each individual. Any benefits resulting from botulism toxin tend to wear off after 3 months with a repeat injection required if benefit is to be maintained. Injections are usually done every 3-4 months with maximum effect peak achieved by about 2 or 3 weeks. Botulism toxin is expensive and you should be sure of what costs you will incur resulting from the injection.  The side effects of botulism toxin use for chronic migraine may include:              -Transient, and usually mild, facial weakness with facial injections             -Transient, and usually mild, head or neck weakness with head/neck injections             -Reduction or loss of forehead facial animation due to forehead muscle              weakness             -Eyelid drooping             -Dry eye             -Pain at the site of injection or bruising at the site of injection             -Double vision             -Potential unknown long term risks  Contraindications: You should not have Botox if you are pregnant, nursing, allergic to albumin, have an infection, skin condition, or muscle weakness at the site of the injection, or have myasthenia gravis, Lambert-Eaton syndrome, or ALS.  It is also possible that as with any injection, there may be an allergic reaction or no effect from the medication. Reduced effectiveness after repeated injections is sometimes seen and rarely infection at the injection site may occur. All care will be taken to prevent these side effects. If therapy is  given over a long time, atrophy and wasting in the muscle injected may occur. Occasionally the patient's become refractory to treatment because they develop antibodies to the toxin. In this event, therapy needs to be modified.  Patient acknowledged.    BOTOX PROCEDURE NOTE FOR MIGRAINE HEADACHE    Contraindications and precautions discussed with patient(above). Aseptic procedure was observed and patient tolerated procedure. Procedure performed by Dr. Georgia Dom  The condition has existed for more than 6 months, and pt does not have a diagnosis of ALS, Myasthenia Gravis or Lambert-Eaton Syndrome. Risks and benefits of injections discussed and pt agrees to proceed with the procedure. Written consent obtained  These injections are medically necessary. He receives good benefits from these injections. These injections do not cause sedations or hallucinations which the oral therapies may cause.  Indication/Diagnosis: chronic migraine BOTOX(J0585) injection was performed according to protocol by [facility]. 200 units of BOTOX was dissolved into 4 cc NS.  NDC: [id number]  Botox-100unitsx2 vials Lot: [id number] Expiration: 11/2018 NDC: [id number]  0.9% Sodium Chloride- 10mL total [id number] Expiration: 01/2018 NDC: [id number]  Description of procedure:  The patient was placed  in a sitting position. The standard protocol was used for Botox as follows, with 5 units of Botox injected at each site unless otherwise specified:   -Procerus muscle, midline injection  -Corrugator muscle, bilateral injection  -Frontalis muscle, bilateral injection, with 2 sites each side, medial injection was performed in the upper one third of the frontalis muscle, in the region vertical from the medial inferior edge of the superior orbital rim. The lateral injection was again in the upper one third of the forehead vertically above the lateral limbus of the cornea, 1.5 cm lateral  to the medial injection site.  -Temporalis muscle injection, 4 sites, bilaterally. The first injection was 3 cm above the tragus of the ear, second injection site was 1.5 cm to 3 cm up from the first injection site in line with the tragus of the ear. The third injection site was 1.5-3 cm forward between the first 2 injection sites. The fourth injection site was 1.5 cm posterior to the second injection site. 4 additional injections were provided in the left temporalis muscle.  -Occipitalis muscle injection, 3 sites, bilaterally. The first injection was done one half way between the occipital protuberance and the tip of the mastoid process behind the ear. The second injection site was done lateral and superior to the first, 1 fingerbreadth from the first injection. The third injection site was 1 fingerbreadth superiorly and medially from the first injection site.  -Cervical paraspinal muscle injection, 2 sites, bilaterally first injection site was 1 cm from the midline of the cervical spine, 3 cm inferior to the lower border of the occipital protuberance. The second injection site was 1.5 cm superiorly and laterally to the first injection site. 4 additional injections were provided in the left cervical paraspinal and trapezius muscles. .   -Trapezius muscle injection was performed at 3 sites, bilaterally. The first injection site was in the upper trapezius muscle halfway between the inflection point of the neck, and the acromion. The second injection site was one half way between the acromion and the first injection site. The third injection was done between the first injection site and the inflection point of the neck.   Will return for repeat injection in 3 months.   A 200 unit sof Botox was used, 195 units were injected, the rest of the Botox was wasted(5 units). The patient tolerated the procedure well, there were no complications of the above procedure.

## 2016-06-08 NOTE — Progress Notes (Signed)
Botox-100unitsx2 vials Lot: [id number] Expiration: 11/2018 NDC: [id number]  0.9% Sodium Chloride- 33mL total [id number] Expiration: 01/2018 NDC: [id number]

## 2016-09-06 ENCOUNTER — Telehealth: Payer: Self-pay | Admitting: Neurology

## 2016-09-06 ENCOUNTER — Ambulatory Visit (INDEPENDENT_AMBULATORY_CARE_PROVIDER_SITE_OTHER): Payer: Medicaid Other | Admitting: Neurology

## 2016-09-06 ENCOUNTER — Encounter: Payer: Self-pay | Admitting: Neurology

## 2016-09-06 VITALS — BP 139/95 | HR 84 | Ht 66.0 in | Wt 234.0 lb

## 2016-09-06 DIAGNOSIS — G43709 Chronic migraine without aura, not intractable, without status migrainosus: Secondary | ICD-10-CM

## 2016-09-06 MED ORDER — DULOXETINE HCL 60 MG PO CPEP: 60.0000 mg | ORAL_CAPSULE | Freq: Every day | ORAL | 6 refills | Status: DC

## 2016-09-06 NOTE — Progress Notes (Signed)
Botox-100unitsx2 vials Lot: [id number] Expiration: 01/2019 NDC: [id number]  0.9% Sodium Chloride bacteriostatic- 45mL total Lot: [id number] Expiration: 02/16/2018 NDC: [id number]  Dx: [id number] [facility] mark: specialty

## 2016-09-06 NOTE — Progress Notes (Signed)
Tried: Topamax(side effects), propranolol(side effects), Nortriptyline(currently on and helping), Inderal(currently on and helping), maxalt(didn't help), zofran(haven't taken it in a while - reorderd), Imitrex(didn't work). She had only 1-2 migraines in the last month. These migraines lasted a few days. Felt nausea and shakiness, dizziness, ears hurt. Tried Zembrace today, she will call. She stopped taking the nortriptyline and the inderal 6 months ago now not on anything except botox for migraine. The botox has significantly helped her migraines by at least 50% frequency and decreased severity.   Consent Form Botulism Toxin Injection For Chronic Migraine  Botulism toxin has been approved by the Federal drug administration for treatment of chronic migraine. Botulism toxin does not cure chronic migraine and it may not be effective in some patients.  The administration of botulism toxin is accomplished by injecting a small amount of toxin into the muscles of the neck and head. Dosage must be titrated for each individual. Any benefits resulting from botulism toxin tend to wear off after 3 months with a repeat injection required if benefit is to be maintained. Injections are usually done every 3-4 months with maximum effect peak achieved by about 2 or 3 weeks. Botulism toxin is expensive and you should be sure of what costs you will incur resulting from the injection.  The side effects of botulism toxin use for chronic migraine may include:   -Transient, and usually mild, facial weakness with facial injections  -Transient, and usually mild, head or neck weakness with head/neck injections  -Reduction or loss of forehead facial animation due to forehead muscle              weakness  -Eyelid drooping  -Dry eye  -Pain at the site of injection or bruising at the site of injection  -Double vision  -Potential unknown long term risks  Contraindications: You should not have Botox if you are pregnant,  nursing, allergic to albumin, have an infection, skin condition, or muscle weakness at the site of the injection, or have myasthenia gravis, Lambert-Eaton syndrome, or ALS.  It is also possible that as with any injection, there may be an allergic reaction or no effect from the medication. Reduced effectiveness after repeated injections is sometimes seen and rarely infection at the injection site may occur. All care will be taken to prevent these side effects. If therapy is given over a long time, atrophy and wasting in the muscle injected may occur. Occasionally the patient's become refractory to treatment because they develop antibodies to the toxin. In this event, therapy needs to be modified.  I have read the above information and consent to the administration of botulism toxin.    ______________  _____   _________________  Patient signature     Date   Witness signature       BOTOX PROCEDURE NOTE FOR MIGRAINE HEADACHE    Contraindications and precautions discussed with patient(above). Aseptic procedure was observed and patient tolerated procedure. Procedure performed by Dr. Georgia Dom  The condition has existed for more than 6 months, and pt does not have a diagnosis of ALS, Myasthenia Gravis or Lambert-Eaton Syndrome. Risks and benefits of injections discussed and pt agrees to proceed with the procedure. Written consent obtained  These injections are medically necessary. He receives good benefits from these injections. These injections do not cause sedations or hallucinations which the oral therapies may cause.  Indication/Diagnosis: chronic migraine BOTOX(J0585) injection was performed according to protocol by [facility]. 200 units of BOTOX was dissolved into 4 cc  NS.  NDC: [id number]  Type of toxin: Botox Botox-100unitsx2 vials Lot: [id number] Expiration: 01/2019 NDC: [id number]  0.9% Sodium Chloride bacteriostatic- 49mL total Lot: [id number] Expiration:  02/16/2018 NDC: [id number]  Dx: [id number] [facility] Care mark: specialty    Description of procedure:  The patient was placed in a sitting position. The standard protocol was used for Botox as follows, with 5 units of Botox injected at each site:   -Procerus muscle, midline injection  -Corrugator muscle, bilateral injection  -Frontalis muscle, bilateral injection, with 2 sites each side, medial injection was performed in the upper one third of the frontalis muscle, in the region vertical from the medial inferior edge of the superior orbital rim. The lateral injection was again in the upper one third of the forehead vertically above the lateral limbus of the cornea, 1.5 cm lateral to the medial injection site.  -Temporalis muscle injection, 4 sites, bilaterally. The first injection was 3 cm above the tragus of the ear, second injection site was 1.5 cm to 3 cm up from the first injection site in line with the tragus of the ear. The third injection site was 1.5-3 cm forward between the first 2 injection sites. The fourth injection site was 1.5 cm posterior to the second injection site.  -Occipitalis muscle injection, 3 sites, bilaterally. The first injection was done one half way between the occipital protuberance and the tip of the mastoid process behind the ear. The second injection site was done lateral and superior to the first, 1 fingerbreadth from the first injection. The third injection site was 1 fingerbreadth superiorly and medially from the first injection site.  -Cervical paraspinal muscle injection, 2 sites, bilateral knee first injection site was 1 cm from the midline of the cervical spine, 3 cm inferior to the lower border of the occipital protuberance. The second injection site was 1.5 cm superiorly and laterally to the first injection site.  -Trapezius muscle injection was performed at 3 sites, bilaterally. The first injection site was in the upper trapezius muscle halfway between the  inflection point of the neck, and the acromion. The second injection site was one half way between the acromion and the first injection site. The third injection was done between the first injection site and the inflection point of the neck.   Will return for repeat injection in 3 months.   A 200 unit sof Botox was used, 155 units were injected, the rest of the Botox was wasted. The patient tolerated the procedure well, there were no complications of the above procedure.

## 2016-09-06 NOTE — Patient Instructions (Signed)
Remember to drink plenty of fluid, eat healthy meals and do not skip any meals. Try to eat protein with a every meal and eat a healthy snack such as fruit or nuts in between meals. Try to keep a regular sleep-wake schedule and try to exercise daily, particularly in the form of walking, 20-30 minutes a day, if you can.   As far as your medications are concerned, I would like to suggest: Try cymbalta 60mg  daily  I would like to see you back in 12 weeks, sooner if we need to. Please call us with any interim questions, concerns, problems, updates or refill requests.   Our phone number is [phone]. We also have an after hours call service for urgent matters and there is a physician on-call for urgent questions. For any emergencies you know to call 911 or go to the nearest emergency room  Duloxetine delayed-release capsules What is this medicine? DULOXETINE (doo LOX e teen) is used to treat depression, anxiety, and different types of chronic pain and migraibes. This medicine may be used for other purposes; ask your health care provider or pharmacist if you have questions. COMMON BRAND NAME(S): Cymbalta, [place] What should I tell my health care provider before I take this medicine? They need to know if you have any of these conditions: -bipolar disorder or a family history of bipolar disorder -glaucoma -kidney disease -liver disease -suicidal thoughts or a previous suicide attempt -taken medicines called MAOIs like Carbex, Eldepryl, Marplan, Nardil, and Parnate within 14 days -an unusual reaction to duloxetine, other medicines, foods, dyes, or preservatives -pregnant or trying to get pregnant -breast-feeding How should I use this medicine? Take this medicine by mouth with a glass of water. Follow the directions on the prescription label. Do not cut, crush or chew this medicine. You can take this medicine with or without food. Take your medicine at regular intervals. Do not take your medicine  more often than directed. Do not stop taking this medicine suddenly except upon the advice of your doctor. Stopping this medicine too quickly may cause serious side effects or your condition may worsen. A special MedGuide will be given to you by the pharmacist with each prescription and refill. Be sure to read this information carefully each time. Talk to your pediatrician regarding the use of this medicine in children. While this drug may be prescribed for children as young as 39 years of age for selected conditions, precautions do apply. Overdosage: If you think you have taken too much of this medicine contact a poison control center or emergency room at once. NOTE: This medicine is only for you. Do not share this medicine with others. What if I miss a dose? If you miss a dose, take it as soon as you can. If it is almost time for your next dose, take only that dose. Do not take double or extra doses. What may interact with this medicine? Do not take this medicine with any of the following medications: -desvenlafaxine -levomilnacipran -linezolid -MAOIs like Carbex, Eldepryl, Marplan, Nardil, and Parnate -methylene blue (injected into a vein) -milnacipran -thioridazine -venlafaxine This medicine may also interact with the following medications: -alcohol -amphetamines -aspirin and aspirin-like medicines -certain antibiotics like ciprofloxacin and enoxacin -certain medicines for blood pressure, heart disease, irregular heart beat -certain medicines for depression, anxiety, or psychotic disturbances -certain medicines for migraine headache like almotriptan, eletriptan, frovatriptan, naratriptan, rizatriptan, sumatriptan, zolmitriptan -certain medicines that treat or prevent blood clots like warfarin, enoxaparin, and dalteparin -cimetidine -fentanyl -lithium -  NSAIDS, medicines for pain and inflammation, like ibuprofen or naproxen -phentermine -procarbazine -rasagiline -sibutramine -St.  John's wort -theophylline -tramadol -tryptophan This list may not describe all possible interactions. Give your health care provider a list of all the medicines, herbs, non-prescription drugs, or dietary supplements you use. Also tell them if you smoke, drink alcohol, or use illegal drugs. Some items may interact with your medicine. What should I watch for while using this medicine? Tell your doctor if your symptoms do not get better or if they get worse. Visit your doctor or health care professional for regular checks on your progress. Because it may take several weeks to see the full effects of this medicine, it is important to continue your treatment as prescribed by your doctor. Patients and their families should watch out for new or worsening thoughts of suicide or depression. Also watch out for sudden changes in feelings such as feeling anxious, agitated, panicky, irritable, hostile, aggressive, impulsive, severely restless, overly excited and hyperactive, or not being able to sleep. If this happens, especially at the beginning of treatment or after a change in dose, call your health care professional. Dennis Bast may get drowsy or dizzy. Do not drive, use machinery, or do anything that needs mental alertness until you know how this medicine affects you. Do not stand or sit up quickly, especially if you are an older patient. This reduces the risk of dizzy or fainting spells. Alcohol may interfere with the effect of this medicine. Avoid alcoholic drinks. This medicine can cause an increase in blood pressure. This medicine can also cause a sudden drop in your blood pressure, which may make you feel faint and increase the chance of a fall. These effects are most common when you first start the medicine or when the dose is increased, or during use of other medicines that can cause a sudden drop in blood pressure. Check with your doctor for instructions on monitoring your blood pressure while taking this  medicine. Your mouth may get dry. Chewing sugarless gum or sucking hard candy, and drinking plenty of water may help. Contact your doctor if the problem does not go away or is severe. What side effects may I notice from receiving this medicine? Side effects that you should report to your doctor or health care professional as soon as possible: -allergic reactions like skin rash, itching or hives, swelling of the face, lips, or tongue -anxious -breathing problems -confusion -changes in vision -chest pain -confusion -elevated mood, decreased need for sleep, racing thoughts, impulsive behavior -eye pain -fast, irregular heartbeat -feeling faint or lightheaded, falls -feeling agitated, angry, or irritable -hallucination, loss of contact with reality -high blood pressure -loss of balance or coordination -palpitations -redness, blistering, peeling or loosening of the skin, including inside the mouth -restlessness, pacing, inability to keep still -seizures -stiff muscles -suicidal thoughts or other mood changes -trouble passing urine or change in the amount of urine -trouble sleeping -unusual bleeding or bruising -unusually weak or tired -vomiting -yellowing of the eyes or skin Side effects that usually do not require medical attention (report to your doctor or health care professional if they continue or are bothersome): -change in sex drive or performance -change in appetite or weight -constipation -dizziness -dry mouth -headache -increased sweating -nausea -tired This list may not describe all possible side effects. Call your doctor for medical advice about side effects. You may report side effects to FDA at 1-800-FDA-1088. Where should I keep my medicine? Keep out of the reach of children.  Store at room temperature between 20 and 25 degrees C (68 to 77 degrees F). Throw away any unused medicine after the expiration date. NOTE: This sheet is a summary. It may not cover all  possible information. If you have questions about this medicine, talk to your doctor, pharmacist, or health care provider.  2017 [employer]vier/Gold Standard (2016-02-03 18:16:03)

## 2016-09-06 NOTE — Telephone Encounter (Signed)
Please schedule next botox appt.[phone].

## 2016-09-07 ENCOUNTER — Encounter: Payer: Self-pay | Admitting: Neurology

## 2016-09-07 ENCOUNTER — Encounter: Payer: Self-pay | Admitting: *Deleted

## 2016-09-07 NOTE — Telephone Encounter (Signed)
Called and scheduled

## 2016-09-07 NOTE — Telephone Encounter (Signed)
Faxed zembrace enrollment form to [facility]. Fax: [phone]. Received confirmation.

## 2016-09-13 NOTE — Telephone Encounter (Signed)
Faxed completed PA request for zembrace back to [facility]. Fax: [phone]. Received confirmation. Awaiting response.

## 2016-09-21 NOTE — Telephone Encounter (Signed)
Called to check on status of PA zembrace. [id number].  Approved from 09/13/16-09/08/17.  Ref# for call: [id number].

## 2016-11-14 ENCOUNTER — Encounter: Payer: Self-pay | Admitting: Neurology

## 2016-11-21 DIAGNOSIS — Z0271 Encounter for disability determination: Secondary | ICD-10-CM

## 2016-12-07 ENCOUNTER — Encounter: Payer: Self-pay | Admitting: Neurology

## 2016-12-07 ENCOUNTER — Ambulatory Visit (INDEPENDENT_AMBULATORY_CARE_PROVIDER_SITE_OTHER): Payer: Medicaid Other | Admitting: Neurology

## 2016-12-07 VITALS — BP 140/98 | HR 77 | Ht 66.0 in | Wt 237.4 lb

## 2016-12-07 DIAGNOSIS — G43709 Chronic migraine without aura, not intractable, without status migrainosus: Secondary | ICD-10-CM | POA: Diagnosis not present

## 2016-12-07 MED ORDER — FROVATRIPTAN SUCCINATE 2.5 MG PO TABS: 2.5000 mg | ORAL_TABLET | ORAL | 11 refills | Status: DC | PRN

## 2016-12-07 MED ORDER — KETOROLAC TROMETHAMINE 60 MG/2ML IM SOLN: 60.0000 mg | Freq: Once | INTRAMUSCULAR | Status: AC

## 2016-12-07 MED ORDER — NORTRIPTYLINE HCL 50 MG PO CAPS: 50.0000 mg | ORAL_CAPSULE | Freq: Every day | ORAL | 11 refills | Status: DC

## 2016-12-07 NOTE — Progress Notes (Signed)

## 2016-12-07 NOTE — Progress Notes (Signed)
Botox 100 units/vial x 2 vials from [facility] [phone] Lot [id number] Exp 10 2020  Diluted in 4 ml of Bacteriostatic 0.9% NaCl NDC 7209-4709-62 Lot [id number] Exp [id number]  Topical pain reliever applied to injection areas w/ gauze Lidocaine 5% cream NDC 65465-035-46 Lot [id number] Exp 06/19  Toradol 60 mg/2 ml administered IM to L deltoid using aseptic technique. Pt tolerated well.Bandaid applied.

## 2016-12-13 ENCOUNTER — Encounter: Payer: Self-pay | Admitting: Neurology

## 2016-12-14 ENCOUNTER — Encounter: Payer: Self-pay | Admitting: Neurology

## 2016-12-19 ENCOUNTER — Telehealth: Payer: Self-pay

## 2016-12-19 NOTE — Telephone Encounter (Signed)
Called [facility] to initiate PA for Frova. Auth # [id number] ID # [id number]. Sent to pharmacy for review. May check status in 24 hrs.

## 2017-03-01 ENCOUNTER — Telehealth: Payer: Self-pay | Admitting: Neurology

## 2017-03-01 NOTE — Telephone Encounter (Signed)
Called to check status of Botox because it was not TBD on 03/01/2017 . [facility] pharmacy and  They informed needed new Authorization.  Dr. Jaynee Eagles is out of the office Dr. Jannifer Franklin is Work In and Will sign. Faxed form to Medicaid .

## 2017-03-07 ENCOUNTER — Ambulatory Visit (INDEPENDENT_AMBULATORY_CARE_PROVIDER_SITE_OTHER): Payer: Medicaid Other | Admitting: Neurology

## 2017-03-07 VITALS — BP 130/90 | HR 80 | Ht 66.0 in | Wt 234.4 lb

## 2017-03-07 DIAGNOSIS — G43709 Chronic migraine without aura, not intractable, without status migrainosus: Secondary | ICD-10-CM | POA: Diagnosis not present

## 2017-03-07 NOTE — Progress Notes (Signed)
Botox 100 units/vial x 2 vials from [facility] [phone] Lot [id number] Exp 12 2020  Diluted in 4 ml of Bacteriostatic 0.9% NaCl NDC 4695-0722-57 Lot [id number] Exp [id number]  Topical pain reliever applied to injection areas w/ gauze Lidocaine 5% cream NDC 82518-984-21 Lot [id number] Exp 06/19

## 2017-03-07 NOTE — Progress Notes (Signed)
Tried: Topamax(side effects), propranolol(side effects), Nortriptyline(currently on and helping), Inderal(currently on and helping), maxalt(didn't help), zofran(haven't taken it in a while - reorderd), Imitrex(didn't work). She has 6 days of migraines monthly now. she will call. She stopped taking the nortriptyline and the inderal 6 months ago now not on anything except botox for migraine. The botox has significantly helped her migraines by at least 50% frequency and decreased severity. However she still continues to have burden of migraines every month and we'll try Aimovig.  Consent Form Botulism Toxin Injection For Chronic Migraine  Botulism toxin has been approved by the Federal drug administration for treatment of chronic migraine. Botulism toxin does not cure chronic migraine and it may not be effective in some patients.  The administration of botulism toxin is accomplished by injecting a small amount of toxin into the muscles of the neck and head. Dosage must be titrated for each individual. Any benefits resulting from botulism toxin tend to wear off after 3 months with a repeat injection required if benefit is to be maintained. Injections are usually done every 3-4 months with maximum effect peak achieved by about 2 or 3 weeks. Botulism toxin is expensive and you should be sure of what costs you will incur resulting from the injection.  The side effects of botulism toxin use for chronic migraine may include:   -Transient, and usually mild, facial weakness with facial injections  -Transient, and usually mild, head or neck weakness with head/neck injections  -Reduction or loss of forehead facial animation due to forehead muscle              weakness  -Eyelid drooping  -Dry eye  -Pain at the site of injection or bruising at the site of injection  -Double vision  -Potential unknown long term risks  Contraindications: You should not have Botox if you are pregnant, nursing, allergic to  albumin, have an infection, skin condition, or muscle weakness at the site of the injection, or have myasthenia gravis, Lambert-Eaton syndrome, or ALS.  It is also possible that as with any injection, there may be an allergic reaction or no effect from the medication. Reduced effectiveness after repeated injections is sometimes seen and rarely infection at the injection site may occur. All care will be taken to prevent these side effects. If therapy is given over a long time, atrophy and wasting in the muscle injected may occur. Occasionally the patient's become refractory to treatment because they develop antibodies to the toxin. In this event, therapy needs to be modified.  I have read the above information and consent to the administration of botulism toxin.    ______________  _____   _________________  Patient signature     Date   Witness signature       BOTOX PROCEDURE NOTE FOR MIGRAINE HEADACHE    Contraindications and precautions discussed with patient(above). Aseptic procedure was observed and patient tolerated procedure. Procedure performed by Dr. Georgia Dom  The condition has existed for more than 6 months, and pt does not have a diagnosis of ALS, Myasthenia Gravis or Lambert-Eaton Syndrome. Risks and benefits of injections discussed and pt agrees to proceed with the procedure. Written consent obtained  These injections are medically necessary. He receives good benefits from these injections. These injections do not cause sedations or hallucinations which the oral therapies may cause.  Indication/Diagnosis: chronic migraine BOTOX(J0585) injection was performed according to protocol by [employer]. 200 units of BOTOX was dissolved into 4 cc NS.  NDC: [address]-35  See attached note  Description of procedure:  The patient was placed in a sitting position. The standard protocol was used for Botox as follows, with 5 units of Botox injected at each site:   -Procerus muscle,  midline injection  -Corrugator muscle, bilateral injection  -Frontalis muscle, bilateral injection, with 2 sites each side, medial injection was performed in the upper one third of the frontalis muscle, in the region vertical from the medial inferior edge of the superior orbital rim. The lateral injection was again in the upper one third of the forehead vertically above the lateral limbus of the cornea, 1.5 cm lateral to the medial injection site.  -Temporalis muscle injection, 4 sites, bilaterally. The first injection was 3 cm above the tragus of the ear, second injection site was 1.5 cm to 3 cm up from the first injection site in line with the tragus of the ear. The third injection site was 1.5-3 cm forward between the first 2 injection sites. The fourth injection site was 1.5 cm posterior to the second injection site.  -Occipitalis muscle injection, 3 sites, bilaterally. The first injection was done one half way between the occipital protuberance and the tip of the mastoid process behind the ear. The second injection site was done lateral and superior to the first, 1 fingerbreadth from the first injection. The third injection site was 1 fingerbreadth superiorly and medially from the first injection site.  -Cervical paraspinal muscle injection, 2 sites, bilateral knee first injection site was 1 cm from the midline of the cervical spine, 3 cm inferior to the lower border of the occipital protuberance. The second injection site was 1.5 cm superiorly and laterally to the first injection site.  -Trapezius muscle injection was performed at 3 sites, bilaterally. The first injection site was in the upper trapezius muscle halfway between the inflection point of the neck, and the acromion. The second injection site was one half way between the acromion and the first injection site. The third injection was done between the first injection site and the inflection point of the neck.   Will return for repeat  injection in 3 months.   A 200 unit sof Botox was used, 155 units were injected, the rest of the Botox was wasted. The patient tolerated the procedure well, there were no complications of the above procedure.

## 2017-04-30 ENCOUNTER — Encounter: Payer: Self-pay | Admitting: Neurology

## 2017-05-07 ENCOUNTER — Encounter: Payer: Self-pay | Admitting: Neurology

## 2017-05-14 ENCOUNTER — Encounter: Payer: Self-pay | Admitting: Neurology

## 2017-05-15 ENCOUNTER — Encounter: Payer: Self-pay | Admitting: Neurology

## 2017-05-16 ENCOUNTER — Encounter: Payer: Self-pay | Admitting: Neurology

## 2017-05-16 ENCOUNTER — Other Ambulatory Visit: Payer: Self-pay | Admitting: *Deleted

## 2017-05-16 ENCOUNTER — Ambulatory Visit (INDEPENDENT_AMBULATORY_CARE_PROVIDER_SITE_OTHER): Payer: Medicaid Other | Admitting: Neurology

## 2017-05-16 VITALS — BP 142/95 | HR 83 | Resp 16 | Ht 66.0 in | Wt 242.0 lb

## 2017-05-16 DIAGNOSIS — G43011 Migraine without aura, intractable, with status migrainosus: Secondary | ICD-10-CM | POA: Diagnosis not present

## 2017-05-16 MED ORDER — ZONISAMIDE 50 MG PO CAPS: 100.0000 mg | ORAL_CAPSULE | Freq: Every day | ORAL | 6 refills | Status: DC

## 2017-05-16 MED ORDER — BUTALBITAL-APAP-CAFFEINE 50-325-40 MG PO TABS: 1.0000 | ORAL_TABLET | Freq: Four times a day (QID) | ORAL | 5 refills | Status: DC | PRN

## 2017-05-16 NOTE — Patient Instructions (Addendum)
Acetaminophen; Butalbital; Caffeine tablets or capsules What is this medicine? ACETAMINOPHEN; BUTALBITAL; CAFFEINE (a set a MEE noe fen; byoo TAL bi tal; KAF een) is a pain reliever. It is used to treat tension headaches. This medicine may be used for other purposes; ask your health care provider or pharmacist if you have questions. COMMON BRAND NAME(S): Alagesic, Americet, Anolor-300, Arcet, BAC, CAPACET, Dolgic Plus, Esgic, Esgic Plus, Ezol, Fioricet, [employer], Medigesic, Glidden, Pacaps, Phrenilin Forte, Repan, Marlin, Triad, Zebutal What should I tell my health care provider before I take this medicine? They need to know if you have any of these conditions: -drug abuse or addiction -heart or circulation problems -if you often drink alcohol -kidney disease or problems going to the bathroom -liver disease -lung disease, asthma, or breathing problems -porphyria -an unusual or allergic reaction to acetaminophen, butalbital or other barbiturates, caffeine, other medicines, foods, dyes, or preservatives -pregnant or trying to get pregnant -breast-feeding How should I use this medicine? Take this medicine by mouth with a full glass of water. Follow the directions on the prescription label. If the medicine upsets your stomach, take the medicine with food or milk. Do not take more than you are told to take. Talk to your pediatrician regarding the use of this medicine in children. Special care may be needed. Overdosage: If you think you have taken too much of this medicine contact a poison control center or emergency room at once. NOTE: This medicine is only for you. Do not share this medicine with others. What if I miss a dose? If you miss a dose, take it as soon as you can. If it is almost time for your next dose, take only that dose. Do not take double or extra doses. What may interact with this medicine? -alcohol or medicines that contain alcohol -antidepressants, especially MAOIs like  isocarboxazid, phenelzine, tranylcypromine, and selegiline -antihistamines -benzodiazepines -carbamazepine -isoniazid -medicines for pain like pentazocine, buprenorphine, butorphanol, nalbuphine, tramadol, and propoxyphene -muscle relaxants -naltrexone -phenobarbital, phenytoin, and fosphenytoin -phenothiazines like perphenazine, thioridazine, chlorpromazine, mesoridazine, fluphenazine, prochlorperazine, promazine, and trifluoperazine -voriconazole This list may not describe all possible interactions. Give your health care provider a list of all the medicines, herbs, non-prescription drugs, or dietary supplements you use. Also tell them if you smoke, drink alcohol, or use illegal drugs. Some items may interact with your medicine. What should I watch for while using this medicine? Tell your doctor or health care professional if your pain does not go away, if it gets worse, or if you have new or a different type of pain. You may develop tolerance to the medicine. Tolerance means that you will need a higher dose of the medicine for pain relief. Tolerance is normal and is expected if you take the medicine for a long time. Do not suddenly stop taking your medicine because you may develop a severe reaction. Your body becomes used to the medicine. This does NOT mean you are addicted. Addiction is a behavior related to getting and using a drug for a non-medical reason. If you have pain, you have a medical reason to take pain medicine. Your doctor will tell you how much medicine to take. If your doctor wants you to stop the medicine, the dose will be slowly lowered over time to avoid any side effects. You may get drowsy or dizzy when you first start taking the medicine or change doses. Do not drive, use machinery, or do anything that may be dangerous until you know how the medicine affects you.  Stand or sit up slowly. Do not take other medicines that contain acetaminophen with this medicine. Always read  labels carefully. If you have questions, ask your doctor or pharmacist. If you take too much acetaminophen get medical help right away. Too much acetaminophen can be very dangerous and cause liver damage. Even if you do not have symptoms, it is important to get help right away. What side effects may I notice from receiving this medicine? Side effects that you should report to your doctor or health care professional as soon as possible: -allergic reactions like skin rash, itching or hives, swelling of the face, lips, or tongue -breathing problems -confusion -feeling faint or lightheaded, falls -redness, blistering, peeling or loosening of the skin, including inside the mouth -seizure -stomach pain -yellowing of the eyes or skin Side effects that usually do not require medical attention (report to your doctor or health care professional if they continue or are bothersome): -constipation -nausea, vomiting This list may not describe all possible side effects. Call your doctor for medical advice about side effects. You may report side effects to FDA at 1-800-FDA-1088. Where should I keep my medicine? Keep out of the reach of children. This medicine can be abused. Keep your medicine in a safe place to protect it from theft. Do not share this medicine with anyone. Selling or giving away this medicine is dangerous and against the law. This medicine may cause accidental overdose and death if it taken by other adults, children, or pets. Mix any unused medicine with a substance like cat litter or coffee grounds. Then throw the medicine away in a sealed container like a sealed bag or a coffee can with a lid. Do not use the medicine after the expiration date. Store at room temperature between 15 and 30 degrees C (59 and 86 degrees F). NOTE: This sheet is a summary. It may not cover all possible information. If you have questions about this medicine, talk to your doctor, pharmacist, or health care provider.   2018 Elsevier/Gold Standard (2013-10-31 15:00:25)      Zonisamide capsules What is this medicine? ZONISAMIDE (zoe NIS a mide) is used to control partial seizures in adults with epilepsy. This medicine may be used for other purposes; ask your health care provider or pharmacist if you have questions. COMMON BRAND NAME(S): Zonegran What should I tell my health care provider before I take this medicine? They need to know if you have any of these conditions: -dehydrated -diarrhea -history of metabolic acidosis (too much acid in your blood) -ketogenic diet -kidney disease -liver disease -lung disease -osteoporosis -suicidal thoughts, plans, or attempt; a previous suicide attempt by you or a family member -an unusual or allergic reaction to zonisamide, sulfa drugs, other medicines, foods, dyes, or preservatives -pregnant or trying to get pregnant -breast-feeding How should I use this medicine? Take this medicine by mouth with a glass of water. Follow the directions on the prescription label. Swallow whole. Do not break open the capsule. This medicine may be taken with or without food. Take your doses at regular intervals. Do not take your medicine more often than directed. Do not stop taking this medicine unless instructed by your doctor or health care professional. A special MedGuide will be given to you by the pharmacist with each prescription and refill. Be sure to read this information carefully each time. Talk to your pediatrician regarding the use of this medicine in children. While this drug may be prescribed for children as young as 53  years of age for selected conditions, precautions do apply. Overdosage: If you think you have taken too much of this medicine contact a poison control center or emergency room at once. NOTE: This medicine is only for you. Do not share this medicine with others. What if I miss a dose? If you miss a dose, take it as soon as you can. If it is almost time  for your next dose, take only that dose. Do not take double or extra doses. What may interact with this medicine? This medicine may interact with the following medications -alcohol -antihistamines for allergy, cough and cold -antiviral medicines for HIV or AIDS -certain antibiotics like rifabutin, rifampin -certain medicines for anxiety or sleep -certain medicines for depression like amitriptyline, fluoxetine, sertraline -certain medicines for seizures like carbamazepine, phenobarbital, phenytoin, topiramate -digoxin -diuretics like acetazolamide, dichlorphenamide -general anesthetics like halothane, isoflurane, methoxyflurane, propofol -local anesthetics like lidocaine, pramoxine, tetracaine -medicines that relax muscles for surgery -narcotic medicines for pain -phenothiazines like chlorpromazine, mesoridazine, prochlorperazine, thioridazine -quinidine This list may not describe all possible interactions. Give your health care provider a list of all the medicines, herbs, non-prescription drugs, or dietary supplements you use. Also tell them if you smoke, drink alcohol, or use illegal drugs. Some items may interact with your medicine. What should I watch for while using this medicine? Visit your doctor or health care professional for regular checks on your progress. Wear a medical identification bracelet or chain to say you have epilepsy, and carry a card that lists all your medications. It is important to take this medicine exactly as directed. When first starting treatment, your dose will need to be adjusted slowly. It may take weeks or months before your dose is stable. You should contact your doctor or health care professional if your seizures get worse or if you have any new types of seizures. Do not stop taking except on your doctor's advice. You may develop a severe reaction. Your doctor will tell you how much medicine to take. You may get drowsy, dizzy, or have blurred vision. Do not  drive, use machinery, or do anything that needs mental alertness until you know how this medicine affects you. To reduce dizzy or fainting spells, do not sit or stand up quickly, especially if you are an older patient. Alcohol can increase drowsiness and dizziness. Avoid alcoholic drinks. Avoid extreme heat. This medicine can cause you to sweat less than normal. Your body temperature could increase to dangerous levels, which may lead to heat stroke. This medicine may increase the chance of developing metabolic acidosis. If left untreated, this can cause kidney stones, bone disease, or slowed growth in children. Symptoms include breathing fast, fatigue, loss of appetite, irregular heartbeat, or loss of consciousness. Call your doctor immediately if you experience any of these side effects. Also, tell your doctor about any surgery you plan on having while taking this medicine since this may increase your risk for metabolic acidosis. This medicines may increase the risk of kidney stones. Drinking 6 to 8 glasses of water a day may help prevent the formation of kidney stones. The use of this medicine may increase the chance of suicidal thoughts or actions. Pay special attention to how you are responding while on this medicine. Any worsening of mood, or thoughts of suicide or dying should be reported to your health care professional right away. Women who become pregnant while using this medicine may enroll in the [place] Pregnancy Registry by calling [phone]. This registry  collects information about the safety of antiepileptic drug use during pregnancy. What side effects may I notice from receiving this medicine? Side effects that you should report to your doctor or health care professional as soon as possible: -allergic reactions like skin rash, itching or hives, swelling of the face, lips, or tongue -decreased sweating or a rise in body temperature, especially in patients under  89 years old -difficulty breathing or tightening of the throat -feeling faint or lightheaded, falls -fever, sore throat, sores in your mouth, or bruising easily -hallucination, loss of contact with reality -irregular heartbeat -loss of appetite -redness, blistering, peeling or loosening of the skin, including inside the mouth -severe drowsiness, difficulty concentrating, or coordination problems -speech or language problems -sudden back pain, abdominal pain, pain when urinating, bloody or dark urine -suicidal thoughts or depression -unusual changes in behavior or mood -unusually weak or tired -vomiting Side effects that usually do not require medical attention (report to your doctor or health care professional if they continue or are bothersome): -headache -nausea This list may not describe all possible side effects. Call your doctor for medical advice about side effects. You may report side effects to FDA at 1-800-FDA-1088. Where should I keep my medicine? Keep out of reach of children. Store at room temperature between 15 and 30 degrees C (59 and 86 degrees F). Keep in a dry place protected from light. Throw away any unused medicine after the expiration date. NOTE: This sheet is a summary. It may not cover all possible information. If you have questions about this medicine, talk to your doctor, pharmacist, or health care provider.  2018 Elsevier/Gold Standard (2015-10-07 09:50:49)

## 2017-05-16 NOTE — Progress Notes (Signed)
[facility]    Provider:  Dr Kelly Marsh Referring Provider: Kristie Cowman, MD Primary Care Physician:  Kelly Cowman, MD  CC: Chronic migraines  HPI:  Kelly Marsh is a 41 y.o. female here as a referral from Dr. Ronnald Marsh for migraines, she in [place] and botox. She is having an increase in the migraines, last week had migraines 3 days in a row for example. Patient has significantly improved since starting Botox but continues to have monthly migraines that are refractory to acute management. We discussed all the options today and decided to try Fioricet only 10 times a month and discussed addiction, rebound headache possibility.  Tried: Topamax(side effects), Relpax, imitrex PO and injectable, Migrainal, cymbalta, robaxin, Frova(?), zembrace, propranolol(side effects), Nortriptyline, Inderal, maxalt(didn't help), zofran(haven't taken it in a while - reorderd), Imitrex(didn't work). She stopped taking the nortriptyline and the inderal 6 months ago now not on anything except botox for migraine. The botox has significantly helped her migraines by at least 50% frequency and decreased severity. However she still continues to have burden of migraines every month and we'll try Aimovig.  Review of Systems: Patient complains of symptoms per HPI as well as the following symptoms: no rashes, fevers. Pertinent negatives and positives per HPI. All others negative.   Social History   Social History  . Marital status: Married    [name]   . Number of children: 5  . Years of education: 10   Occupational History  . Not on file.   Social History Main Topics  . Smoking status: Never Smoker  . Smokeless tobacco: Never Used  . Alcohol use No  . Drug use: No  . Sexual activity: Not on file   Other Topics Concern  . Not on file   Social History Narrative   Lives at home with husband and family   Caffeine use: 1 cup coffee per week       Family History  Problem Relation  Age of Onset  . Diabetes Mother   . Cancer Maternal Grandmother   . Cancer Maternal Grandfather   . Migraines Neg Hx     Past Medical History:  Diagnosis Date  . Migraine   . Snoring     Past Surgical History:  Procedure Laterality Date  . NO PAST SURGERIES      Current Outpatient Prescriptions  Medication Sig Dispense Refill  . CAZIANT 0.1/0.125/0.15 -0.025 MG tablet TK 1 T PO D  11  . eletriptan (RELPAX) 40 MG tablet Take 40 mg by mouth as needed for migraine or headache. May repeat in 2 hours if headache persists or recurs.    Eduard Roux (AIMOVIG) 70 MG/ML SOAJ Inject 70 mg into the skin every 30 (thirty) days.     No current facility-administered medications for this visit.     Allergies as of 05/16/2017  . (No Known Allergies)    Vitals: BP (!) 142/95 (BP Location: Right Arm, Patient Position: Sitting, Cuff Size: Large)   Pulse 83   Resp 16   Ht 5\' 6"  (1.676 m)   Wt 242 lb (109.8 kg)   LMP 04/25/2017 (Within Weeks)   SpO2 100%   BMI 39.06 kg/m  Last Weight:  Wt Readings from Last 1 Encounters:  05/16/17 242 lb (109.8 kg)   Last Height:   Ht Readings from Last 1 Encounters:  05/16/17 5\' 6"  (1.676 m)    Physical exam: Exam: Gen: NAD, conversant, well nourised, obese, well groomed  CV: RRR, no MRG. No Carotid Bruits. No peripheral edema, warm, nontender Eyes: Conjunctivae clear without exudates or hemorrhage  Neuro: Detailed Neurologic Exam  Speech:    Speech is normal; fluent and spontaneous with normal comprehension.  Cognition:    The patient is oriented to person, place, and time;     recent and remote memory intact;     language fluent;     normal attention, concentration,     fund of knowledge Cranial Nerves:    The pupils are equal, round, and reactive to light. The fundi are normal and spontaneous venous pulsations are present. Visual fields are full to finger confrontation. Extraocular movements are intact.  Trigeminal sensation is intact and the muscles of mastication are normal. The face is symmetric. The palate elevates in the midline. Hearing intact. Voice is normal. Shoulder shrug is normal. The tongue has normal motion without fasciculations.   Coordination:    Normal finger to nose and heel to shin. Normal rapid alternating movements.   Gait:    Heel-toe and tandem gait are normal.   Motor Observation:    No asymmetry, no atrophy, and no involuntary movements noted. Tone:    Normal muscle tone.    Posture:    Posture is normal. normal erect    Strength:    Strength is V/V in the upper and lower limbs.      Sensation: intact to LT     Reflex Exam:  DTR's:    Deep tendon reflexes in the upper and lower extremities are normal bilaterally.   Toes:    The toes are downgoing bilaterally.   Clonus:    Clonus is absent.      Assessment/Plan:  Patient with intractable chronic migraines on Botox and CGRP treatments as failed multiple other treatments and has been refractory to multiple acute management medications including several triptans we can try Fioricet very limited only 10 times a month and did warn of rebound and addiction possibility.  Sarina Ill, MD  [facility] [address] [place], [place] [address]  Phone [phone] Fax [phone]  A total of 15 minutes was spent face-to-face with this patient. Over half this time was spent on counseling patient on the chronic migraine diagnosis and different diagnostic and therapeutic options available.

## 2017-05-16 NOTE — Telephone Encounter (Signed)
Faxed printed/signed rx to pt pharmacy at [phone]. Received confirmation.

## 2017-06-13 ENCOUNTER — Other Ambulatory Visit: Payer: Self-pay | Admitting: Neurology

## 2017-06-13 ENCOUNTER — Encounter: Payer: Self-pay | Admitting: Neurology

## 2017-06-13 ENCOUNTER — Ambulatory Visit (INDEPENDENT_AMBULATORY_CARE_PROVIDER_SITE_OTHER): Payer: Medicaid Other | Admitting: Neurology

## 2017-06-13 VITALS — BP 136/96 | HR 76 | Ht 66.0 in | Wt 239.4 lb

## 2017-06-13 DIAGNOSIS — M542 Cervicalgia: Principal | ICD-10-CM

## 2017-06-13 DIAGNOSIS — M5412 Radiculopathy, cervical region: Secondary | ICD-10-CM

## 2017-06-13 DIAGNOSIS — G43711 Chronic migraine without aura, intractable, with status migrainosus: Secondary | ICD-10-CM

## 2017-06-13 DIAGNOSIS — G8929 Other chronic pain: Secondary | ICD-10-CM

## 2017-06-13 DIAGNOSIS — M7918 Myalgia, other site: Secondary | ICD-10-CM

## 2017-06-13 MED ORDER — ZONISAMIDE 50 MG PO CAPS: 150.0000 mg | ORAL_CAPSULE | Freq: Every day | ORAL | 6 refills | Status: DC

## 2017-06-13 MED ORDER — DICLOFENAC POTASSIUM(MIGRAINE) 50 MG PO PACK: 50.0000 mg | PACK | Freq: Once | ORAL | 11 refills | Status: DC | PRN

## 2017-06-13 NOTE — Progress Notes (Signed)
Significant improvement in migraines, 7 migraine days a month which unfortunately can still be severe. Has failed multiple acute medications.    Increase Zonisamide to 150mg  at bedtime Cambia at onset of headache   Physical Therapy: Cervical myofascial pain, forward posture contributing to migraines and cervicalgia. Please evaluate and treat including dry needling, stretching, strengthening, manual therapy/massage, heating, TENS unit, exercising for scapular stabilization, pectoral stretching and rhomboid strengthening as clinically warranted as well as any other modality as recommended by evaluation.   Increase zonisamide for prevention Try Cambia for acute management Fioricet with side effects    Consent Form Botulism Toxin Injection For Chronic Migraine  Botulism toxin has been approved by the Federal drug administration for treatment of chronic migraine. Botulism toxin does not cure chronic migraine and it may not be effective in some patients.  The administration of botulism toxin is accomplished by injecting a small amount of toxin into the muscles of the neck and head. Dosage must be titrated for each individual. Any benefits resulting from botulism toxin tend to wear off after 3 months with a repeat injection required if benefit is to be maintained. Injections are usually done every 3-4 months with maximum effect peak achieved by about 2 or 3 weeks. Botulism toxin is expensive and you should be sure of what costs you will incur resulting from the injection.  The side effects of botulism toxin use for chronic migraine may include:   -Transient, and usually mild, facial weakness with facial injections  -Transient, and usually mild, head or neck weakness with head/neck injections  -Reduction or loss of forehead facial animation due to forehead muscle              weakness  -Eyelid drooping  -Dry eye  -Pain at the site of injection or bruising at the site of  injection  -Double vision  -Potential unknown long term risks  Contraindications: You should not have Botox if you are pregnant, nursing, allergic to albumin, have an infection, skin condition, or muscle weakness at the site of the injection, or have myasthenia gravis, Lambert-Eaton syndrome, or ALS.  It is also possible that as with any injection, there may be an allergic reaction or no effect from the medication. Reduced effectiveness after repeated injections is sometimes seen and rarely infection at the injection site may occur. All care will be taken to prevent these side effects. If therapy is given over a long time, atrophy and wasting in the muscle injected may occur. Occasionally the patient's become refractory to treatment because they develop antibodies to the toxin. In this event, therapy needs to be modified.  I have read the above information and consent to the administration of botulism toxin.    ______________  _____   _________________  Patient signature     Date   Witness signature       BOTOX PROCEDURE NOTE FOR MIGRAINE HEADACHE    Contraindications and precautions discussed with patient(above). Aseptic procedure was observed and patient tolerated procedure. Procedure performed by Dr. Georgia Dom  The condition has existed for more than 6 months, and pt does not have a diagnosis of ALS, Myasthenia Gravis or Lambert-Eaton Syndrome. Risks and benefits of injections discussed and pt agrees to proceed with the procedure. Written consent obtained  These injections are medically necessary. He receives good benefits from these injections. These injections do not cause sedations or hallucinations which the oral therapies may cause.  Indication/Diagnosis: chronic migraine BOTOX(J0585) injection was performed according  to protocol by [employer]. 200 units of BOTOX was dissolved into 4 cc NS.  NDC: [address]-63   Description of procedure:  The patient was placed in a  sitting position. The standard protocol was used for Botox as follows, with 5 units of Botox injected at each site:   -Procerus muscle, midline injection  -Corrugator muscle, bilateral injection  -Frontalis muscle, bilateral injection, with 2 sites each side, medial injection was performed in the upper one third of the frontalis muscle, in the region vertical from the medial inferior edge of the superior orbital rim. The lateral injection was again in the upper one third of the forehead vertically above the lateral limbus of the cornea, 1.5 cm lateral to the medial injection site.  -Temporalis muscle injection, 4 sites, bilaterally. The first injection was 3 cm above the tragus of the ear, second injection site was 1.5 cm to 3 cm up from the first injection site in line with the tragus of the ear. The third injection site was 1.5-3 cm forward between the first 2 injection sites. The fourth injection site was 1.5 cm posterior to the second injection site.  -Occipitalis muscle injection, 3 sites, bilaterally. The first injection was done one half way between the occipital protuberance and the tip of the mastoid process behind the ear. The second injection site was done lateral and superior to the first, 1 fingerbreadth from the first injection. The third injection site was 1 fingerbreadth superiorly and medially from the first injection site.  -Cervical paraspinal muscle injection, 2 sites, bilateral knee first injection site was 1 cm from the midline of the cervical spine, 3 cm inferior to the lower border of the occipital protuberance. The second injection site was 1.5 cm superiorly and laterally to the first injection site.  -Trapezius muscle injection was performed at 3 sites, bilaterally. The first injection site was in the upper trapezius muscle halfway between the inflection point of the neck, and the acromion. The second injection site was one half way between the acromion and the first  injection site. The third injection was done between the first injection site and the inflection point of the neck.   Will return for repeat injection in 3 months.   A 200 unit sof Botox was used, 155 units were injected, the rest of the Botox was wasted. The patient tolerated the procedure well, there were no complications of the above procedure.

## 2017-06-13 NOTE — Addendum Note (Signed)
Addended by: Sarina Ill B on: 06/13/2017 08:51 AM   Modules accepted: Orders

## 2017-06-13 NOTE — Patient Instructions (Addendum)
Remember to drink plenty of fluid, eat healthy meals and do not skip any meals. Try to eat protein with a every meal and eat a healthy snack such as fruit or nuts in between meals. Try to keep a regular sleep-wake schedule and try to exercise daily, particularly in the form of walking, 20-30 minutes a day, if you can.   As far as your medications are concerned, I would like to suggest:  Increase Zonisamide to 150mg  at bedtime Cambia at onset of headache Discussed Cephaly  I would like to see you back in 12 weeks for botox, sooner if we need to. Please call us with any interim questions, concerns, problems, updates or refill requests.   Please also call us for any test results so we can go over those with you on the phone.  My clinical assistant and will answer any of your questions and relay your messages to me and also relay most of my messages to you.   Our phone number is [phone]. We also have an after hours call service for urgent matters and there is a physician on-call for urgent questions. For any emergencies you know to call 911 or go to the nearest emergency room

## 2017-06-13 NOTE — Progress Notes (Signed)
Botox-100unitsx2 vials Lot: [id number] Expiration: 11/2019 NDC: 5825-1898-42 [id number]  0.9% Sodium Chloride- 24mL total [id number] Expiration: 10/2018 NDC: 1594-7076-15  Specialty pharmacy Dx: [phone]

## 2017-06-22 ENCOUNTER — Telehealth: Payer: Self-pay | Admitting: *Deleted

## 2017-06-22 NOTE — Telephone Encounter (Signed)
I called pt and LMVM for her to return call.  She had received 2 samples in august and was f/u if she is still taking and had gotten from PAP program from [employer].

## 2017-06-27 MED ORDER — ERENUMAB-AOOE 70 MG/ML ~~LOC~~ SOAJ: 70.0000 mg | SUBCUTANEOUS | 11 refills | Status: DC

## 2017-06-27 NOTE — Telephone Encounter (Signed)
LMVM for pt that did place prescription to local pharmacy [facility] [place] [address] for [employer].  You may get letters for denial and needing PA.  Just FYI. Please call back for questions.

## 2017-07-13 NOTE — Telephone Encounter (Signed)
Called and LVM asking for call back.   I am going to inform her that her PA was denied was Aimovig and that if she wants to continue medication for now, she can call [phone] to setup program for free samples for 180 days and to make an appt within the next 5 months to see Dr. Jaynee Eagles to f/u on how Aimovig is helping.

## 2017-07-19 NOTE — Telephone Encounter (Signed)
Called and spoke with pt. She states that she had previously received 2 doses of Aimovig in the mail and then just recently received another 4 additional doses from [facility]. I called and spoke with [facility] and they stated that it looks like Medicaid has approved it. Dr. Jaynee Eagles aware.

## 2017-09-06 ENCOUNTER — Encounter: Payer: Self-pay | Admitting: Neurology

## 2017-09-06 ENCOUNTER — Ambulatory Visit: Payer: Medicaid Other | Admitting: Neurology

## 2017-09-06 ENCOUNTER — Telehealth: Payer: Self-pay | Admitting: Neurology

## 2017-09-06 VITALS — BP 146/104 | HR 80

## 2017-09-06 DIAGNOSIS — G43711 Chronic migraine without aura, intractable, with status migrainosus: Secondary | ICD-10-CM

## 2017-09-06 MED ORDER — DICLOFENAC POTASSIUM 25 MG PO CAPS: 25.0000 mg | ORAL_CAPSULE | Freq: Two times a day (BID) | ORAL | 3 refills | Status: DC | PRN

## 2017-09-06 MED ORDER — NARATRIPTAN HCL 2.5 MG PO TABS: ORAL_TABLET | ORAL | 12 refills | Status: DC

## 2017-09-06 NOTE — Telephone Encounter (Signed)
Pt. Needs botox every 3 months.

## 2017-09-06 NOTE — Progress Notes (Signed)
Botox-100 units x 2 vials Lot: [id number] Expiration: 02/2020 NDC: 8309-4076-80  Bacteriostatic 0.9% Sodium Chloride- 30mL total Lot: [id number] Expiration: 01/17/2019 NDC: 1594-5859-29  Dx: W44.628 S/P //BCrn

## 2017-09-06 NOTE — Patient Instructions (Signed)
At onset of headache: Naratriptan and Zipsor immediately. May repeat in 2 hours

## 2017-09-07 NOTE — Telephone Encounter (Signed)
I called and scheduled the patient for her next injection. She states that the pharmacy is waiting on authorization for a new rx Dr. Jaynee Eagles just started her on, she wasn't sure of the name but she states that it was a triptan.

## 2017-09-12 NOTE — Telephone Encounter (Signed)
I completed pa for naratriptan via [facility]. Should have a determination within 24 hours. PA # [id number]. [place] [facility] phone number [phone].

## 2017-09-12 NOTE — Progress Notes (Signed)

## 2017-09-13 ENCOUNTER — Telehealth: Payer: Self-pay | Admitting: Neurology

## 2017-09-13 NOTE — Telephone Encounter (Signed)
I called medicaid, pt's naratriptan has been approved from 09/12/17-09/07/18. I called pt and advised her of this, pt will be able to pick up naratriptan at [facility]. Pt verbalized understanding.

## 2017-09-13 NOTE — Telephone Encounter (Signed)
I have already completed a pa yesterday for naratriptan via [place] medicaid. It will take 24 hours for a determination. I will call pt when we have a determination.

## 2017-09-13 NOTE — Telephone Encounter (Signed)
Thank you, let me know the outcome

## 2017-09-13 NOTE — Telephone Encounter (Signed)
Pt is needing Dr authorization for naratriptan (AMERGE) 2.5 MG tablet at [place], [place] - [address] AT [address]

## 2017-10-01 DIAGNOSIS — Z0271 Encounter for disability determination: Secondary | ICD-10-CM

## 2017-12-06 ENCOUNTER — Telehealth: Payer: Self-pay | Admitting: Neurology

## 2017-12-06 ENCOUNTER — Encounter: Payer: Self-pay | Admitting: Neurology

## 2017-12-06 ENCOUNTER — Ambulatory Visit: Payer: Medicaid Other | Admitting: Neurology

## 2017-12-06 DIAGNOSIS — G43711 Chronic migraine without aura, intractable, with status migrainosus: Secondary | ICD-10-CM | POA: Diagnosis not present

## 2017-12-06 NOTE — Progress Notes (Signed)
Botox- 100 units x 2 vials Lot: [id number] Expiration: 04/2020 NDC: 0947-0962-83  Bacteriostatic 0.9% Sodium Chloride- 73mL total Lot: [id number] Expiration: 01/17/2019 NDC: 6546-5035-46  Dx: F68.127 S/P  //BCrn

## 2017-12-06 NOTE — Progress Notes (Signed)
Interval history 12/06/2017: >50% reduction in frequency and severity of migraines and headaches since starting botox. +eyes and masseters + levator scapulae.  Consent Form Botulism Toxin Injection For Chronic Migraine  Botulism toxin has been approved by the Federal drug administration for treatment of chronic migraine. Botulism toxin does not cure chronic migraine and it may not be effective in some patients.  The administration of botulism toxin is accomplished by injecting a small amount of toxin into the muscles of the neck and head. Dosage must be titrated for each individual. Any benefits resulting from botulism toxin tend to wear off after 3 months with a repeat injection required if benefit is to be maintained. Injections are usually done every 3-4 months with maximum effect peak achieved by about 2 or 3 weeks. Botulism toxin is expensive and you should be sure of what costs you will incur resulting from the injection.  The side effects of botulism toxin use for chronic migraine may include:   -Transient, and usually mild, facial weakness with facial injections  -Transient, and usually mild, head or neck weakness with head/neck injections  -Reduction or loss of forehead facial animation due to forehead muscle              weakness  -Eyelid drooping  -Dry eye  -Pain at the site of injection or bruising at the site of injection  -Double vision  -Potential unknown long term risks  Contraindications: You should not have Botox if you are pregnant, nursing, allergic to albumin, have an infection, skin condition, or muscle weakness at the site of the injection, or have myasthenia gravis, Lambert-Eaton syndrome, or ALS.  It is also possible that as with any injection, there may be an allergic reaction or no effect from the medication. Reduced effectiveness after repeated injections is sometimes seen and rarely infection at the injection site may occur. All care will be taken to prevent  these side effects. If therapy is given over a long time, atrophy and wasting in the muscle injected may occur. Occasionally the patient's become refractory to treatment because they develop antibodies to the toxin. In this event, therapy needs to be modified.  I have read the above information and consent to the administration of botulism toxin.    ______________  _____   _________________  Patient signature     Date   Witness signature       BOTOX PROCEDURE NOTE FOR MIGRAINE HEADACHE    Contraindications and precautions discussed with patient(above). Aseptic procedure was observed and patient tolerated procedure. Procedure performed by Dr. Georgia Dom  The condition has existed for more than 6 months, and pt does not have a diagnosis of ALS, Myasthenia Gravis or Lambert-Eaton Syndrome. Risks and benefits of injections discussed and pt agrees to proceed with the procedure. Written consent obtained  These injections are medically necessary. He receives good benefits from these injections. These injections do not cause sedations or hallucinations which the oral therapies may cause.  Indication/Diagnosis: chronic migraine BOTOX(J0585) injection was performed according to protocol by [employer]. 200 units of BOTOX was dissolved into 4 cc NS.  NDC: [address]-78   Description of procedure:  The patient was placed in a sitting position. The standard protocol was used for Botox as follows, with 5 units of Botox injected at each site:   -Procerus muscle, midline injection  -Corrugator muscle, bilateral injection  -Frontalis muscle, bilateral injection, with 2 sites each side, medial injection was performed in the upper one third of  the frontalis muscle, in the region vertical from the medial inferior edge of the superior orbital rim. The lateral injection was again in the upper one third of the forehead vertically above the lateral limbus of the cornea, 1.5 cm lateral to the medial  injection site.  -Temporalis muscle injection, 4 sites, bilaterally. The first injection was 3 cm above the tragus of the ear, second injection site was 1.5 cm to 3 cm up from the first injection site in line with the tragus of the ear. The third injection site was 1.5-3 cm forward between the first 2 injection sites. The fourth injection site was 1.5 cm posterior to the second injection site.  -Occipitalis muscle injection, 3 sites, bilaterally. The first injection was done one half way between the occipital protuberance and the tip of the mastoid process behind the ear. The second injection site was done lateral and superior to the first, 1 fingerbreadth from the first injection. The third injection site was 1 fingerbreadth superiorly and medially from the first injection site.  -Cervical paraspinal muscle injection, 2 sites, bilateral knee first injection site was 1 cm from the midline of the cervical spine, 3 cm inferior to the lower border of the occipital protuberance. The second injection site was 1.5 cm superiorly and laterally to the first injection site.  -Trapezius muscle injection was performed at 3 sites, bilaterally. The first injection site was in the upper trapezius muscle halfway between the inflection point of the neck, and the acromion. The second injection site was one half way between the acromion and the first injection site. The third injection was done between the first injection site and the inflection point of the neck.   Will return for repeat injection in 3 months.   A 200 unit sof Botox was used, 155 units were injected, the rest of the Botox was wasted. The patient tolerated the procedure well, there were no complications of the above procedure.   

## 2017-12-06 NOTE — Telephone Encounter (Signed)
I called and scheduled the patient for her injection.  °

## 2017-12-06 NOTE — Telephone Encounter (Signed)
Pt. Needs 12 wk. Botox injection

## 2017-12-12 ENCOUNTER — Encounter: Payer: Self-pay | Admitting: Neurology

## 2017-12-13 ENCOUNTER — Encounter: Payer: Self-pay | Admitting: Neurology

## 2017-12-17 ENCOUNTER — Encounter: Payer: Self-pay | Admitting: Neurology

## 2017-12-18 ENCOUNTER — Other Ambulatory Visit: Payer: Self-pay | Admitting: Neurology

## 2017-12-18 ENCOUNTER — Encounter: Payer: Self-pay | Admitting: Neurology

## 2017-12-18 MED ORDER — RIZATRIPTAN BENZOATE 10 MG PO TBDP: 10.0000 mg | ORAL_TABLET | ORAL | 11 refills | Status: DC | PRN

## 2018-01-28 ENCOUNTER — Encounter: Payer: Self-pay | Admitting: Neurology

## 2018-01-29 ENCOUNTER — Telehealth: Payer: Self-pay | Admitting: *Deleted

## 2018-01-29 ENCOUNTER — Encounter: Payer: Self-pay | Admitting: Neurology

## 2018-01-29 NOTE — Telephone Encounter (Signed)
Faxed PA for continuation of Aimovig 70 mg to [facility]. Received a receipt of confirmation.

## 2018-02-05 NOTE — Telephone Encounter (Signed)
Received notification from [facility] that Aimovig 70 mg was not approved due to pt not meeting the required clinical criteria.   Called [place] [facility] to discuss. Spoke with representative and was told that the patient must meet the initial request criteria as well as the continuation request. Can send in another PA request form with both sections filled out and they will process the request again.   ID# [id number]

## 2018-02-07 NOTE — Telephone Encounter (Signed)
Faxed Aimovig 70 mg PA to [facility] with completion of both initial request and continuation request sections completed. Received a receipt of confirmation.

## 2018-02-13 NOTE — Telephone Encounter (Signed)
Spoke with [place] [facility]. PA for AImovig denied because pt has not had a negative pregnant test at baseline.

## 2018-02-19 ENCOUNTER — Other Ambulatory Visit: Payer: Self-pay | Admitting: *Deleted

## 2018-02-19 DIAGNOSIS — Z79899 Other long term (current) drug therapy: Secondary | ICD-10-CM

## 2018-02-19 NOTE — Telephone Encounter (Signed)
Wow, second time this has happened. Please order the urine test.

## 2018-02-19 NOTE — Telephone Encounter (Signed)
Discussed with patient. She is aware that Medicaid requires a negative pregnancy test. Pt agreed to taking urine test. She is aware that she can come in anytime during office hours to test. Reviewed office hours with pt. Will send repeat PA as soon as we have a negative test.   Order placed for urine pregnancy test.

## 2018-02-20 ENCOUNTER — Telehealth: Payer: Self-pay | Admitting: Neurology

## 2018-02-20 NOTE — Telephone Encounter (Signed)
PA request will need to be submitted through [place] [facility] via fax form. Refill request will need to be initiated through [place]. DW

## 2018-02-21 ENCOUNTER — Other Ambulatory Visit (INDEPENDENT_AMBULATORY_CARE_PROVIDER_SITE_OTHER): Payer: Self-pay

## 2018-02-21 DIAGNOSIS — Z0289 Encounter for other administrative examinations: Secondary | ICD-10-CM

## 2018-02-21 DIAGNOSIS — Z79899 Other long term (current) drug therapy: Secondary | ICD-10-CM

## 2018-02-22 LAB — PREGNANCY, URINE: PREG TEST UR: NEGATIVE

## 2018-02-22 NOTE — Telephone Encounter (Signed)
Called pt & informed her that the pregnancy test was negative and new PA was sent in for [facility] with updated info. Expect determination within 24 hours however office will be closed over weekend so pt will need to check with her pharmacy or call [facility]. Pt verbalized understanding and appreciation. Received a receipt of confirmation after PA was faxed with negative pregnancy result and copy of medicaid card.

## 2018-02-26 ENCOUNTER — Encounter: Payer: Self-pay | Admitting: *Deleted

## 2018-02-26 NOTE — Telephone Encounter (Signed)
Called [facility] to receive update on Aimovig 70 mg PA. Medication has been proved through 02/17/2019.

## 2018-02-26 NOTE — Telephone Encounter (Signed)
I called [facility] they have the botox delivered for 03/05/18.

## 2018-03-14 ENCOUNTER — Encounter: Payer: Self-pay | Admitting: Neurology

## 2018-03-14 ENCOUNTER — Ambulatory Visit: Payer: Medicaid Other | Admitting: Neurology

## 2018-03-14 VITALS — BP 131/87 | HR 75

## 2018-03-14 DIAGNOSIS — G43711 Chronic migraine without aura, intractable, with status migrainosus: Secondary | ICD-10-CM | POA: Diagnosis not present

## 2018-03-14 NOTE — Progress Notes (Signed)
Botox- 100 units x 2 vials Lot: [id number] Expiration: 07/2020 NDC: 8381-8403-75  Bacteriostatic 0.9% Sodium Chloride- 79mL total Lot: [id number] Expiration: 01/17/2019 NDC: 7034-0352-48  Dx: L85.909 S/P

## 2018-03-14 NOTE — Progress Notes (Signed)
Interval history 12/06/2017: >50% reduction in frequency and severity of migraines and headaches since starting botox. She had a little right eye droop last time, did not do eyes, just couugators and procerus, temples and masseters + levator scapulae.  Consent Form Botulism Toxin Injection For Chronic Migraine  Botulism toxin has been approved by the Federal drug administration for treatment of chronic migraine. Botulism toxin does not cure chronic migraine and it may not be effective in some patients.  The administration of botulism toxin is accomplished by injecting a small amount of toxin into the muscles of the neck and head. Dosage must be titrated for each individual. Any benefits resulting from botulism toxin tend to wear off after 3 months with a repeat injection required if benefit is to be maintained. Injections are usually done every 3-4 months with maximum effect peak achieved by about 2 or 3 weeks. Botulism toxin is expensive and you should be sure of what costs you will incur resulting from the injection.  The side effects of botulism toxin use for chronic migraine may include:   -Transient, and usually mild, facial weakness with facial injections  -Transient, and usually mild, head or neck weakness with head/neck injections  -Reduction or loss of forehead facial animation due to forehead muscle              weakness  -Eyelid drooping  -Dry eye  -Pain at the site of injection or bruising at the site of injection  -Double vision  -Potential unknown long term risks  Contraindications: You should not have Botox if you are pregnant, nursing, allergic to albumin, have an infection, skin condition, or muscle weakness at the site of the injection, or have myasthenia gravis, Lambert-Eaton syndrome, or ALS.  It is also possible that as with any injection, there may be an allergic reaction or no effect from the medication. Reduced effectiveness after repeated injections is sometimes seen  and rarely infection at the injection site may occur. All care will be taken to prevent these side effects. If therapy is given over a long time, atrophy and wasting in the muscle injected may occur. Occasionally the patient's become refractory to treatment because they develop antibodies to the toxin. In this event, therapy needs to be modified.  I have read the above information and consent to the administration of botulism toxin.    ______________  _____   _________________  Patient signature     Date   Witness signature       BOTOX PROCEDURE NOTE FOR MIGRAINE HEADACHE    Contraindications and precautions discussed with patient(above). Aseptic procedure was observed and patient tolerated procedure. Procedure performed by Dr. Georgia Dom  The condition has existed for more than 6 months, and pt does not have a diagnosis of ALS, Myasthenia Gravis or Lambert-Eaton Syndrome. Risks and benefits of injections discussed and pt agrees to proceed with the procedure. Written consent obtained  These injections are medically necessary. He receives good benefits from these injections. These injections do not cause sedations or hallucinations which the oral therapies may cause.  Indication/Diagnosis: chronic migraine BOTOX(J0585) injection was performed according to protocol by [employer]. 200 units of BOTOX was dissolved into 4 cc NS.  NDC: [address]-18   Description of procedure:  The patient was placed in a sitting position. The standard protocol was used for Botox as follows, with 5 units of Botox injected at each site:   -Procerus muscle, midline injection  -Corrugator muscle, bilateral injection  -Frontalis muscle,  bilateral injection, with 2 sites each side, medial injection was performed in the upper one third of the frontalis muscle, in the region vertical from the medial inferior edge of the superior orbital rim. The lateral injection was again in the upper one third of the  forehead vertically above the lateral limbus of the cornea, 1.5 cm lateral to the medial injection site.  -Temporalis muscle injection, 4 sites, bilaterally. The first injection was 3 cm above the tragus of the ear, second injection site was 1.5 cm to 3 cm up from the first injection site in line with the tragus of the ear. The third injection site was 1.5-3 cm forward between the first 2 injection sites. The fourth injection site was 1.5 cm posterior to the second injection site.  -Occipitalis muscle injection, 3 sites, bilaterally. The first injection was done one half way between the occipital protuberance and the tip of the mastoid process behind the ear. The second injection site was done lateral and superior to the first, 1 fingerbreadth from the first injection. The third injection site was 1 fingerbreadth superiorly and medially from the first injection site.  -Cervical paraspinal muscle injection, 2 sites, bilateral knee first injection site was 1 cm from the midline of the cervical spine, 3 cm inferior to the lower border of the occipital protuberance. The second injection site was 1.5 cm superiorly and laterally to the first injection site.  -Trapezius muscle injection was performed at 3 sites, bilaterally. The first injection site was in the upper trapezius muscle halfway between the inflection point of the neck, and the acromion. The second injection site was one half way between the acromion and the first injection site. The third injection was done between the first injection site and the inflection point of the neck.   Will return for repeat injection in 3 months.   A 200 unit sof Botox was used, 155 units were injected, the rest of the Botox was wasted. The patient tolerated the procedure well, there were no complications of the above procedure.

## 2018-05-28 ENCOUNTER — Telehealth: Payer: Self-pay | Admitting: Neurology

## 2018-05-28 NOTE — Telephone Encounter (Signed)
Refill request [facility] [phone].

## 2018-05-28 NOTE — Telephone Encounter (Signed)
As of right it is TBD for 06/04/18 but a new authorization is needed.

## 2018-05-31 NOTE — Telephone Encounter (Signed)
PA request sent over to [facility].

## 2018-06-10 ENCOUNTER — Encounter: Payer: Self-pay | Admitting: Neurology

## 2018-06-10 ENCOUNTER — Ambulatory Visit: Payer: Medicaid Other | Admitting: Neurology

## 2018-06-10 VITALS — Ht 66.0 in | Wt 234.6 lb

## 2018-06-10 DIAGNOSIS — G43711 Chronic migraine without aura, intractable, with status migrainosus: Secondary | ICD-10-CM

## 2018-06-10 MED ORDER — CYCLOBENZAPRINE HCL 10 MG PO TABS: 10.0000 mg | ORAL_TABLET | Freq: Every day | ORAL | 11 refills | Status: DC

## 2018-06-10 MED ORDER — METOCLOPRAMIDE HCL 10 MG PO TABS: 10.0000 mg | ORAL_TABLET | Freq: Four times a day (QID) | ORAL | 6 refills | Status: DC | PRN

## 2018-06-10 NOTE — Progress Notes (Signed)
Interval history 12/06/2017: >50% reduction in frequency and severity of migraines and headaches since starting botox. She had a little right eye droop last time, did not do eyes, NO couugators or procerus, +temples and +masseters + levator scapulae, di dthe forehead high.  Try Reglan for acute migraine. Take with ibuprofen or tylenol.   Consent Form Botulism Toxin Injection For Chronic Migraine  Botulism toxin has been approved by the Federal drug administration for treatment of chronic migraine. Botulism toxin does not cure chronic migraine and it may not be effective in some patients.  The administration of botulism toxin is accomplished by injecting a small amount of toxin into the muscles of the neck and head. Dosage must be titrated for each individual. Any benefits resulting from botulism toxin tend to wear off after 3 months with a repeat injection required if benefit is to be maintained. Injections are usually done every 3-4 months with maximum effect peak achieved by about 2 or 3 weeks. Botulism toxin is expensive and you should be sure of what costs you will incur resulting from the injection.  The side effects of botulism toxin use for chronic migraine may include:   -Transient, and usually mild, facial weakness with facial injections  -Transient, and usually mild, head or neck weakness with head/neck injections  -Reduction or loss of forehead facial animation due to forehead muscle              weakness  -Eyelid drooping  -Dry eye  -Pain at the site of injection or bruising at the site of injection  -Double vision  -Potential unknown long term risks  Contraindications: You should not have Botox if you are pregnant, nursing, allergic to albumin, have an infection, skin condition, or muscle weakness at the site of the injection, or have myasthenia gravis, Lambert-Eaton syndrome, or ALS.  It is also possible that as with any injection, there may be an allergic reaction or no  effect from the medication. Reduced effectiveness after repeated injections is sometimes seen and rarely infection at the injection site may occur. All care will be taken to prevent these side effects. If therapy is given over a long time, atrophy and wasting in the muscle injected may occur. Occasionally the patient's become refractory to treatment because they develop antibodies to the toxin. In this event, therapy needs to be modified.  I have read the above information and consent to the administration of botulism toxin.    ______________  _____   _________________  Patient signature     Date   Witness signature       BOTOX PROCEDURE NOTE FOR MIGRAINE HEADACHE    Contraindications and precautions discussed with patient(above). Aseptic procedure was observed and patient tolerated procedure. Procedure performed by Dr. Georgia Dom  The condition has existed for more than 6 months, and pt does not have a diagnosis of ALS, Myasthenia Gravis or Lambert-Eaton Syndrome. Risks and benefits of injections discussed and pt agrees to proceed with the procedure. Written consent obtained  These injections are medically necessary. He receives good benefits from these injections. These injections do not cause sedations or hallucinations which the oral therapies may cause.  Indication/Diagnosis: chronic migraine BOTOX(J0585) injection was performed according to protocol by [employer]. 200 units of BOTOX was dissolved into 4 cc NS.  NDC: [address]-54   Description of procedure:  The patient was placed in a sitting position. The standard protocol was used for Botox as follows, with 5 units of Botox injected at  each site:   -Procerus muscle, midline injection  -Corrugator muscle, bilateral injection  -Frontalis muscle, bilateral injection, with 2 sites each side, medial injection was performed in the upper one third of the frontalis muscle, in the region vertical from the medial inferior edge of  the superior orbital rim. The lateral injection was again in the upper one third of the forehead vertically above the lateral limbus of the cornea, 1.5 cm lateral to the medial injection site.  -Temporalis muscle injection, 4 sites, bilaterally. The first injection was 3 cm above the tragus of the ear, second injection site was 1.5 cm to 3 cm up from the first injection site in line with the tragus of the ear. The third injection site was 1.5-3 cm forward between the first 2 injection sites. The fourth injection site was 1.5 cm posterior to the second injection site.  -Occipitalis muscle injection, 3 sites, bilaterally. The first injection was done one half way between the occipital protuberance and the tip of the mastoid process behind the ear. The second injection site was done lateral and superior to the first, 1 fingerbreadth from the first injection. The third injection site was 1 fingerbreadth superiorly and medially from the first injection site.  -Cervical paraspinal muscle injection, 2 sites, bilateral knee first injection site was 1 cm from the midline of the cervical spine, 3 cm inferior to the lower border of the occipital protuberance. The second injection site was 1.5 cm superiorly and laterally to the first injection site.  -Trapezius muscle injection was performed at 3 sites, bilaterally. The first injection site was in the upper trapezius muscle halfway between the inflection point of the neck, and the acromion. The second injection site was one half way between the acromion and the first injection site. The third injection was done between the first injection site and the inflection point of the neck.   Will return for repeat injection in 3 months.   A 200 unit sof Botox was used, 155 units were injected, the rest of the Botox was wasted. The patient tolerated the procedure well, there were no complications of the above procedure.

## 2018-06-10 NOTE — Progress Notes (Signed)
Botox- 100 units x 2 vials Lot: [id number] Expiration: 11/2020 NDC: 6922-3009-79  0.9% Sodium Chloride- 4 mL total Lot: [id number] Expiration: 10/20 NDC: 09906-893-40 Dx:  G84.033 S/P

## 2018-06-10 NOTE — Patient Instructions (Signed)
Cyclobenzaprine tablets What is this medicine? CYCLOBENZAPRINE (sye kloe BEN za preen) is a muscle relaxer. It is used to treat muscle pain, spasms, and stiffness. This medicine may be used for other purposes; ask your health care provider or pharmacist if you have questions. COMMON BRAND NAME(S): Fexmid, Flexeril What should I tell my health care provider before I take this medicine? They need to know if you have any of these conditions: -heart disease, irregular heartbeat, or previous heart attack -liver disease -thyroid problem -an unusual or allergic reaction to cyclobenzaprine, tricyclic antidepressants, lactose, other medicines, foods, dyes, or preservatives -pregnant or trying to get pregnant -breast-feeding How should I use this medicine? Take this medicine by mouth with a glass of water. Follow the directions on the prescription label. If this medicine upsets your stomach, take it with food or milk. Take your medicine at regular intervals. Do not take it more often than directed. Talk to your pediatrician regarding the use of this medicine in children. Special care may be needed. Overdosage: If you think you have taken too much of this medicine contact a poison control center or emergency room at once. NOTE: This medicine is only for you. Do not share this medicine with others. What if I miss a dose? If you miss a dose, take it as soon as you can. If it is almost time for your next dose, take only that dose. Do not take double or extra doses. What may interact with this medicine? Do not take this medicine with any of the following medications: -certain medicines for fungal infections like fluconazole, itraconazole, ketoconazole, posaconazole, voriconazole -cisapride -dofetilide -dronedarone -halofantrine -levomethadyl -MAOIs like Carbex, Eldepryl, Marplan, Nardil, and Parnate -narcotic medicines for cough -pimozide -thioridazine -ziprasidone This medicine may also interact  with the following medications: -alcohol -antihistamines for allergy, cough and cold -certain medicines for anxiety or sleep -certain medicines for cancer -certain medicines for depression like amitriptyline, fluoxetine, sertraline -certain medicines for infection like alfuzosin, chloroquine, clarithromycin, levofloxacin, mefloquine, pentamidine, troleandomycin -certain medicines for irregular heart beat -certain medicines for seizures like phenobarbital, primidone -contrast dyes -general anesthetics like halothane, isoflurane, methoxyflurane, propofol -local anesthetics like lidocaine, pramoxine, tetracaine -medicines that relax muscles for surgery -narcotic medicines for pain -other medicines that prolong the QT interval (cause an abnormal heart rhythm) -phenothiazines like chlorpromazine, mesoridazine, prochlorperazine This list may not describe all possible interactions. Give your health care provider a list of all the medicines, herbs, non-prescription drugs, or dietary supplements you use. Also tell them if you smoke, drink alcohol, or use illegal drugs. Some items may interact with your medicine. What should I watch for while using this medicine? Tell your doctor or health care professional if your symptoms do not start to get better or if they get worse. You may get drowsy or dizzy. Do not drive, use machinery, or do anything that needs mental alertness until you know how this medicine affects you. Do not stand or sit up quickly, especially if you are an older patient. This reduces the risk of dizzy or fainting spells. Alcohol may interfere with the effect of this medicine. Avoid alcoholic drinks. If you are taking another medicine that also causes drowsiness, you may have more side effects. Give your health care provider a list of all medicines you use. Your doctor will tell you how much medicine to take. Do not take more medicine than directed. Call emergency for help if you have  problems breathing or unusual sleepiness. Your mouth may get dry. Chewing  sugarless gum or sucking hard candy, and drinking plenty of water may help. Contact your doctor if the problem does not go away or is severe. What side effects may I notice from receiving this medicine? Side effects that you should report to your doctor or health care professional as soon as possible: -allergic reactions like skin rash, itching or hives, swelling of the face, lips, or tongue -breathing problems -chest pain -fast, irregular heartbeat -hallucinations -seizures -unusually weak or tired Side effects that usually do not require medical attention (report to your doctor or health care professional if they continue or are bothersome): -headache -nausea, vomiting This list may not describe all possible side effects. Call your doctor for medical advice about side effects. You may report side effects to FDA at 1-800-FDA-1088. Where should I keep my medicine? Keep out of the reach of children. Store at room temperature between 15 and 30 degrees C (59 and 86 degrees F). Keep container tightly closed. Throw away any unused medicine after the expiration date. NOTE: This sheet is a summary. It may not cover all possible information. If you have questions about this medicine, talk to your doctor, pharmacist, or health care provider.  2018 Elsevier/Gold Standard (2015-06-15 12:05:46)   Metoclopramide tablets What is this medicine? METOCLOPRAMIDE (met oh kloe PRA mide) is used to treat the symptoms of gastroesophageal reflux disease (GERD) like heartburn. It is also used to treat people with slow emptying of the stomach and intestinal tract. This medicine may be used for other purposes; ask your health care provider or pharmacist if you have questions. COMMON BRAND NAME(S): Reglan What should I tell my health care provider before I take this medicine? They need to know if you have any of these conditions: -breast  cancer -depression -diabetes -heart failure -high blood pressure -kidney disease -liver disease -Parkinson's disease or a movement disorder -pheochromocytoma -seizures -stomach obstruction, bleeding, or perforation -an unusual or allergic reaction to metoclopramide, procainamide, sulfites, other medicines, foods, dyes, or preservatives -pregnant or trying to get pregnant -breast-feeding How should I use this medicine? Take this medicine by mouth with a glass of water. Follow the directions on the prescription label. Take this medicine on an empty stomach, about 30 minutes before eating. Take your doses at regular intervals. Do not take your medicine more often than directed. Do not stop taking except on the advice of your doctor or health care professional. A special MedGuide will be given to you by the pharmacist with each prescription and refill. Be sure to read this information carefully each time. Talk to your pediatrician regarding the use of this medicine in children. Special care may be needed. Overdosage: If you think you have taken too much of this medicine contact a poison control center or emergency room at once. NOTE: This medicine is only for you. Do not share this medicine with others. What if I miss a dose? If you miss a dose, take it as soon as you can. If it is almost time for your next dose, take only that dose. Do not take double or extra doses. What may interact with this medicine? -acetaminophen -cyclosporine -digoxin -medicines for blood pressure -medicines for diabetes, including insulin -medicines for hay fever and other allergies -medicines for depression, especially a Monoamine Oxidase Inhibitor (MAOI) -medicines for Parkinson's disease, like levodopa -medicines for sleep or for pain -quinidine -tetracycline This list may not describe all possible interactions. Give your health care provider a list of all the medicines, herbs, non-prescription drugs,  or  dietary supplements you use. Also tell them if you smoke, drink alcohol, or use illegal drugs. Some items may interact with your medicine. What should I watch for while using this medicine? It may take a few weeks for your stomach condition to start to get better. However, do not take this medicine for longer than 12 weeks. The longer you take this medicine, and the more you take it, the greater your chances are of developing serious side effects. If you are an elderly patient, a female patient, or you have diabetes, you may be at an increased risk for side effects from this medicine. Contact your doctor immediately if you start having movements you cannot control such as lip smacking, rapid movements of the tongue, involuntary or uncontrollable movements of the eyes, head, arms and legs, or muscle twitches and spasms. Patients and their families should watch out for worsening depression or thoughts of suicide. Also watch out for any sudden or severe changes in feelings such as feeling anxious, agitated, panicky, irritable, hostile, aggressive, impulsive, severely restless, overly excited and hyperactive, or not being able to sleep. If this happens, especially at the beginning of treatment or after a change in dose, call your doctor. Do not treat yourself for high fever. Ask your doctor or health care professional for advice. You may get drowsy or dizzy. Do not drive, use machinery, or do anything that needs mental alertness until you know how this drug affects you. Do not stand or sit up quickly, especially if you are an older patient. This reduces the risk of dizzy or fainting spells. Alcohol can make you more drowsy and dizzy. Avoid alcoholic drinks. What side effects may I notice from receiving this medicine? Side effects that you should report to your doctor or health care professional as soon as possible: -allergic reactions like skin rash, itching or hives, swelling of the face, lips, or  tongue -abnormal production of milk in females -breast enlargement in both males and females -change in the way you walk -difficulty moving, speaking or swallowing -drooling, lip smacking, or rapid movements of the tongue -excessive sweating -fever -involuntary or uncontrollable movements of the eyes, head, arms and legs -irregular heartbeat or palpitations -muscle twitches and spasms -unusually weak or tired Side effects that usually do not require medical attention (report to your doctor or health care professional if they continue or are bothersome): -change in sex drive or performance -depressed mood -diarrhea -difficulty sleeping -headache -menstrual changes -restless or nervous This list may not describe all possible side effects. Call your doctor for medical advice about side effects. You may report side effects to FDA at 1-800-FDA-1088. Where should I keep my medicine? Keep out of the reach of children. Store at room temperature between 20 and 25 degrees C (68 and 77 degrees F). Protect from light. Keep container tightly closed. Throw away any unused medicine after the expiration date. NOTE: This sheet is a summary. It may not cover all possible information. If you have questions about this medicine, talk to your doctor, pharmacist, or health care provider.  2018 Elsevier/Gold Standard (2016-06-21 15:13:45)

## 2018-07-16 ENCOUNTER — Other Ambulatory Visit: Payer: Self-pay | Admitting: Neurology

## 2018-08-01 ENCOUNTER — Telehealth: Payer: Self-pay | Admitting: Neurology

## 2018-08-01 NOTE — Telephone Encounter (Signed)
Botox letter regarding [facility] mailed to patient °

## 2018-09-06 ENCOUNTER — Ambulatory Visit: Payer: Medicaid Other | Admitting: Neurology

## 2018-09-06 ENCOUNTER — Telehealth: Payer: Self-pay | Admitting: Neurology

## 2018-09-06 DIAGNOSIS — G43711 Chronic migraine without aura, intractable, with status migrainosus: Secondary | ICD-10-CM | POA: Diagnosis not present

## 2018-09-06 MED ORDER — SUMATRIPTAN 10 MG/ACT NA SOLN: 1.0000 | Freq: Once | NASAL | 0 refills | Status: DC

## 2018-09-06 MED ORDER — SUMATRIPTAN 10 MG/ACT NA SOLN: 1.0000 | NASAL | 11 refills | Status: DC | PRN

## 2018-09-06 NOTE — Progress Notes (Signed)
Botox- 100 units x 2 vials Lot: [id number] Expiration: 12/2020 NDC: 5183-4373-57  Bacteriostatic 0.9% Sodium Chloride- 24mL total Lot: [id number] Expiration: 06/19/2019 NDC: 8412-8208-13  Dx: G87.195 S/P

## 2018-09-06 NOTE — Progress Notes (Signed)
Consent Form Botulism Toxin Injection For Chronic Migraine  Interval history 09/06/2018: This is our 9th botox treatment and she has done exceptionally well with this treatment. >50% reduction in frequency and severity of migraines and headaches since starting botox. +eyes and masseters + levator scapulae. Patient has tried multiple acute meds, she has porr response will try a Tosymra sample.   Reviewed orally with patient, additionally signature is on file:  Botulism toxin has been approved by the Federal drug administration for treatment of chronic migraine. Botulism toxin does not cure chronic migraine and it may not be effective in some patients.  The administration of botulism toxin is accomplished by injecting a small amount of toxin into the muscles of the neck and head. Dosage must be titrated for each individual. Any benefits resulting from botulism toxin tend to wear off after 3 months with a repeat injection required if benefit is to be maintained. Injections are usually done every 3-4 months with maximum effect peak achieved by about 2 or 3 weeks. Botulism toxin is expensive and you should be sure of what costs you will incur resulting from the injection.  The side effects of botulism toxin use for chronic migraine may include:   -Transient, and usually mild, facial weakness with facial injections  -Transient, and usually mild, head or neck weakness with head/neck injections  -Reduction or loss of forehead facial animation due to forehead muscle weakness  -Eyelid drooping  -Dry eye  -Pain at the site of injection or bruising at the site of injection  -Double vision  -Potential unknown long term risks  Contraindications: You should not have Botox if you are pregnant, nursing, allergic to albumin, have an infection, skin condition, or muscle weakness at the site of the injection, or have myasthenia gravis, Lambert-Eaton syndrome, or ALS.  It is also possible that as with any  injection, there may be an allergic reaction or no effect from the medication. Reduced effectiveness after repeated injections is sometimes seen and rarely infection at the injection site may occur. All care will be taken to prevent these side effects. If therapy is given over a long time, atrophy and wasting in the muscle injected may occur. Occasionally the patient's become refractory to treatment because they develop antibodies to the toxin. In this event, therapy needs to be modified.  I have read the above information and consent to the administration of botulism toxin.    BOTOX PROCEDURE NOTE FOR MIGRAINE HEADACHE    Contraindications and precautions discussed with patient(above). Aseptic procedure was observed and patient tolerated procedure. Procedure performed by Dr. Georgia Dom  The condition has existed for more than 6 months, and pt does not have a diagnosis of ALS, Myasthenia Gravis or Lambert-Eaton Syndrome.  Risks and benefits of injections discussed and pt agrees to proceed with the procedure.  Written consent obtained  These injections are medically necessary. Pt  receives good benefits from these injections. These injections do not cause sedations or hallucinations which the oral therapies may cause.  Description of procedure:  The patient was placed in a sitting position. The standard protocol was used for Botox as follows, with 5 units of Botox injected at each site:   -Procerus muscle, midline injection  -Corrugator muscle, bilateral injection  -Frontalis muscle, bilateral injection, with 2 sites each side, medial injection was performed in the upper one third of the frontalis muscle, in the region vertical from the medial inferior edge of the superior orbital rim. The lateral injection  was again in the upper one third of the forehead vertically above the lateral limbus of the cornea, 1.5 cm lateral to the medial injection site.  - Levator Scapulae: 5 units  bilaterally  -Temporalis muscle injection, 5 sites, bilaterally. The first injection was 3 cm above the tragus of the ear, second injection site was 1.5 cm to 3 cm up from the first injection site in line with the tragus of the ear. The third injection site was 1.5-3 cm forward between the first 2 injection sites. The fourth injection site was 1.5 cm posterior to the second injection site. 5th site laterally in the temporalis  muscleat the level of the outer canthus.  - Patient feels her clenching is a trigger for headaches. +5 units masseter bilaterally   - Patient feels the migraines are centered around the eyes +5 units bilaterally at the outer canthus in the orbicularis occuli  -Occipitalis muscle injection, 3 sites, bilaterally. The first injection was done one half way between the occipital protuberance and the tip of the mastoid process behind the ear. The second injection site was done lateral and superior to the first, 1 fingerbreadth from the first injection. The third injection site was 1 fingerbreadth superiorly and medially from the first injection site.  -Cervical paraspinal muscle injection, 2 sites, bilateral knee first injection site was 1 cm from the midline of the cervical spine, 3 cm inferior to the lower border of the occipital protuberance. The second injection site was 1.5 cm superiorly and laterally to the first injection site.  -Trapezius muscle injection was performed at 3 sites, bilaterally. The first injection site was in the upper trapezius muscle halfway between the inflection point of the neck, and the acromion. The second injection site was one half way between the acromion and the first injection site. The third injection was done between the first injection site and the inflection point of the neck.   Will return for repeat injection in 3 months.   A 200 unit sof Botox was used, any Botox not injected was wasted. The patient tolerated the procedure well, there were  no complications of the above procedure.

## 2018-09-06 NOTE — Patient Instructions (Signed)
Sumatriptan nasal spray What is this medicine? SUMATRIPTAN (soo ma TRIP tan) is used to treat migraines with or without aura. An aura is a strange feeling or visual disturbance that warns you of an attack. It is not used to prevent migraines. This medicine may be used for other purposes; ask your health care provider or pharmacist if you have questions. COMMON BRAND NAME(S): Imitrex, Tosymra What should I tell my health care provider before I take this medicine? They need to know if you have any of these conditions: -cigarette smoker -circulation problems in fingers and toes -diabetes -heart disease -high blood pressure -high cholesterol -history of irregular heartbeat -history of stroke -kidney disease -liver disease -stomach or intestine problems -an unusual or allergic reaction to sumatriptan, other medicines, foods, dyes, or preservatives -pregnant or trying to get pregnant -breast-feeding How should I use this medicine? This medicine is for use in the nose. Follow the directions on your product or prescription label. Do not use more often than directed. Make sure that you are using your nasal spray correctly. Ask your doctor or health care professional if you have any questions. Talk to your pediatrician regarding the use of this medicine in children. Special care may be needed. Overdosage: If you think you have taken too much of this medicine contact a poison control center or emergency room at once. NOTE: This medicine is only for you. Do not share this medicine with others. What if I miss a dose? This does not apply. This medicine is not for regular use. What may interact with this medicine? Do not take this medicine with any of the following medicines: -certain medicines for migraine headache like almotriptan, eletriptan, frovatriptan, naratriptan, rizatriptan, sumatriptan, zolmitriptan -ergot alkaloids like dihydroergotamine, ergonovine, ergotamine, methylergonovine -MAOIs  like Carbex, Eldepryl, Marplan, Nardil, and Parnate This medicine may also interact with the following medications: -certain medicines for depression, anxiety, or psychotic disorders This list may not describe all possible interactions. Give your health care provider a list of all the medicines, herbs, non-prescription drugs, or dietary supplements you use. Also tell them if you smoke, drink alcohol, or use illegal drugs. Some items may interact with your medicine. What should I watch for while using this medicine? Visit your healthcare professional for regular checks on your progress. Tell your healthcare professional if your symptoms do not start to get better or if they get worse. You may get drowsy or dizzy. Do not drive, use machinery, or do anything that needs mental alertness until you know how this medicine affects you. Do not stand up or sit up quickly, especially if you are an older patient. This reduces the risk of dizzy or fainting spells. Alcohol may interfere with the effect of this medicine. Tell your healthcare professional right away if you have any change in your eyesight. If you take migraine medicines for 10 or more days a month, your migraines may get worse. Keep a diary of headache days and medicine use. Contact your healthcare professional if your migraine attacks occur more frequently. What side effects may I notice from receiving this medicine? Side effects that you should report to your doctor or health care professional as soon as possible: -allergic reactions like skin rash, itching or hives, swelling of the face, lips, or tongue -changes in vision -chest pain or chest tightness -signs and symptoms of a dangerous change in heartbeat or heart rhythm like chest pain; dizziness; fast, irregular heartbeat; palpitations; feeling faint or lightheaded; falls; breathing problems -signs and symptoms  of a stroke like changes in vision; confusion; trouble speaking or understanding;  severe headaches; sudden numbness or weakness of the face, arm or leg; trouble walking; dizziness; loss of balance or coordination -signs and symptoms of serotonin syndrome like irritable; confusion; diarrhea; fast or irregular heartbeat; muscle twitching; stiff muscles; trouble walking; sweating; high fever; seizures; chills; vomiting Side effects that usually do not require medical attention (report to your doctor or health care professional if they continue or are bothersome): -changes in taste -diarrhea -dizziness -drowsiness -dry mouth -headache -nausea, vomiting -pain, tingling, numbness in the hands or feet -stomach pain This list may not describe all possible side effects. Call your doctor for medical advice about side effects. You may report side effects to FDA at 1-800-FDA-1088. Where should I keep my medicine? Keep out of the reach of children. Store at room temperature between 2 and 30 degrees C (36 and 86 degrees F). Throw away any unused medicine after the expiration date. NOTE: This sheet is a summary. It may not cover all possible information. If you have questions about this medicine, talk to your doctor, pharmacist, or health care provider.  2019 Elsevier/Gold Standard (2018-03-19 15:08:07)

## 2018-09-06 NOTE — Addendum Note (Signed)
Addended by: Sarina Ill B on: 09/06/2018 11:44 AM   Modules accepted: Orders

## 2018-09-06 NOTE — Telephone Encounter (Signed)
3 mo botox

## 2018-09-09 ENCOUNTER — Telehealth: Payer: Self-pay | Admitting: *Deleted

## 2018-09-09 NOTE — Telephone Encounter (Signed)
PA signed by Dr. Krista Blue and faxed to [facility]. Received a receipt of confirmation.

## 2018-09-09 NOTE — Telephone Encounter (Signed)
Completed Tosymra 10 mg PA on via [facility] [employer] form. As Dr. Jaynee Eagles is not here, will obtain signature from covering MD.

## 2018-09-10 ENCOUNTER — Other Ambulatory Visit: Payer: Self-pay | Admitting: Neurology

## 2018-09-16 ENCOUNTER — Encounter: Payer: Self-pay | Admitting: *Deleted

## 2018-09-16 NOTE — Telephone Encounter (Signed)
Received another PA notification for Tosymra from pt's pharmacy. RN called [place] [employer] to inquire about the PA sent in on 09/09/18. Spoke with Jana Half. She stated that the PA was not received (although we received a receipt of confirmation when it was faxed on 09/09/18). PA for Tosymra 10 mg was completed via telephone today.   PA approved from 09/16/18 through 09/11/2019. #[id number].   Reference # for call: [id number]  Faxed approval notification to [facility] on [address]. Received a receipt of confirmation. Sent pt a mychart message to inform her of approval.

## 2018-10-02 NOTE — Telephone Encounter (Signed)
Patient has already been scheduled for next injection.

## 2018-11-28 ENCOUNTER — Telehealth: Payer: Self-pay | Admitting: Neurology

## 2018-11-28 NOTE — Telephone Encounter (Signed)
I called [facility] at [phone] to request a refill on the patients Botox medication. We scheduled delivery for Tuesday 12/03/18 but they stated a PA was needed so I informed them I would obtain PA before then. Delivery will ship as long as PA is approved.   I called [facility] [employer] at [phone] and started PA request. [id number]. DW

## 2018-12-03 NOTE — Telephone Encounter (Signed)
I called [facility] and the PA was approved. It is TBD on 12/04/18.

## 2018-12-06 ENCOUNTER — Other Ambulatory Visit: Payer: Self-pay

## 2018-12-06 ENCOUNTER — Ambulatory Visit: Payer: Medicaid Other | Admitting: Neurology

## 2018-12-06 DIAGNOSIS — G43711 Chronic migraine without aura, intractable, with status migrainosus: Secondary | ICD-10-CM | POA: Diagnosis not present

## 2018-12-06 MED ORDER — UBROGEPANT 50 MG PO TABS: 100.0000 mg | ORAL_TABLET | ORAL | 6 refills | Status: DC | PRN

## 2018-12-06 NOTE — Patient Instructions (Signed)
[employer]: Patient drug information Copyright [phone] [employer] All rights reserved. (For additional information see "U[employer]ant: Drug information") Brand Names: [place]  Roselyn Meier  What is this drug used for?   It is used to treat migraine headaches.  What do I need to tell my doctor BEFORE I take this drug?   If you are allergic to this drug; any part of this drug; or any other drugs, foods, or substances. Tell your doctor about the allergy and what signs you had.   If you have kidney disease.   If you take any drugs (prescription or OTC, natural products, vitamins) that must not be taken with this drug, like certain drugs that are used for HIV, infections, or seizures. There are many drugs that must not be taken with this drug.   This is not a list of all drugs or health problems that interact with this drug.   Tell your doctor and pharmacist about all of your drugs (prescription or OTC, natural products, vitamins) and health problems. You must check to make sure that it is safe for you to take this drug with all of your drugs and health problems. Do not start, stop, or change the dose of any drug without checking with your doctor.  What are some things I need to know or do while I take this drug?   Tell all of your health care providers that you take this drug. This includes your doctors, nurses, pharmacists, and dentists.   If you drink grapefruit juice or eat grapefruit often, talk with your doctor.   Tell your doctor if you are pregnant, plan on getting pregnant, or are breast-feeding. You will need to talk about the benefits and risks to you and the baby.  What are some side effects that I need to call my doctor about right away?   WARNING/CAUTION: Even though it may be rare, some people may have very bad and sometimes deadly side effects when taking a drug. Tell your doctor or get medical help right away if you have any of the following signs or symptoms that may be related to a very  bad side effect:   Signs of an allergic reaction, like rash; hives; itching; red, swollen, blistered, or peeling skin with or without fever; wheezing; tightness in the chest or throat; trouble breathing, swallowing, or talking; unusual hoarseness; or swelling of the mouth, face, lips, tongue, or throat.  What are some other side effects of this drug?   All drugs may cause side effects. However, many people have no side effects or only have minor side effects. Call your doctor or get medical help if any of these side effects or any other side effects bother you or do not go away:   Upset stomach.   Feeling sleepy.   These are not all of the side effects that may occur. If you have questions about side effects, call your doctor. Call your doctor for medical advice about side effects.   You may report side effects to your national health agency.  How is this drug best taken?   Use this drug as ordered by your doctor. Read all information given to you. Follow all instructions closely.   Take with or without food.   Take as early as you can after the attack has started.   If needed, another dose may be taken as your doctor has told you. Be sure you know how many hours to wait before taking another dose.  What do  I do if I miss a dose?   This drug is taken on an as needed basis. Do not take more often than told by the doctor.  How do I store and/or throw out this drug?   Store at room temperature in a dry place. Do not store in a bathroom.   Keep all drugs in a safe place. Keep all drugs out of the reach of children and pets.   Throw away unused or expired drugs. Do not flush down a toilet or pour down a drain unless you are told to do so. Check with your pharmacist if you have questions about the best way to throw out drugs. There may be drug take-back programs in your area.  General drug facts   If your symptoms or health problems do not get better or if they become worse, call your doctor.    Do not share your drugs with others and do not take anyone else's drugs.   Some drugs may have another patient information leaflet. If you have any questions about this drug, please talk with your doctor, nurse, pharmacist, or other health care provider.   If you think there has been an overdose, call your poison control center or get medical care right away. Be ready to tell or show what was taken, how much, and when it happened.  Use of UpToDate is subject to the Subscription and License Agreement. Topic 607371 Version 3.0

## 2018-12-06 NOTE — Progress Notes (Signed)
Consent Form Botulism Toxin Injection For Chronic Migraine  Interval history 12/06/2018 (no changes still doing well): This is our 9th botox treatment and she has done exceptionally well with this treatment. >50% reduction in frequency and severity of migraines and headaches since starting botox. +eyes and masseters + levator scapulae. Patient has tried multiple acute meds, she has porr response will try a Tosymra sample at last appointment but she did not like it. Today gave Ubrelvy samples.  Meds ordered this encounter  Medications  . Ubrogepant (UBRELVY) 50 MG TABS    Sig: Take 100 mg by mouth every 2 (two) hours as needed. Max 200mg  a day    Dispense:  10 tablet    Refill:  6    Order Specific Question:   Lot Number?    Answer:   [id number]    Order Specific Question:   Expiration Date?    Answer:   06/18/2020    Order Specific Question:   NDC    Answer:   7867-6720-94 [[id number]]    Order Specific Question:   Quantity    Answer:   4     Reviewed orally with patient, additionally signature is on file:  Botulism toxin has been approved by the Federal drug administration for treatment of chronic migraine. Botulism toxin does not cure chronic migraine and it may not be effective in some patients.  The administration of botulism toxin is accomplished by injecting a small amount of toxin into the muscles of the neck and head. Dosage must be titrated for each individual. Any benefits resulting from botulism toxin tend to wear off after 3 months with a repeat injection required if benefit is to be maintained. Injections are usually done every 3-4 months with maximum effect peak achieved by about 2 or 3 weeks. Botulism toxin is expensive and you should be sure of what costs you will incur resulting from the injection.  The side effects of botulism toxin use for chronic migraine may include:   -Transient, and usually mild, facial weakness with facial injections  -Transient, and usually mild,  head or neck weakness with head/neck injections  -Reduction or loss of forehead facial animation due to forehead muscle weakness  -Eyelid drooping  -Dry eye  -Pain at the site of injection or bruising at the site of injection  -Double vision  -Potential unknown long term risks  Contraindications: You should not have Botox if you are pregnant, nursing, allergic to albumin, have an infection, skin condition, or muscle weakness at the site of the injection, or have myasthenia gravis, Lambert-Eaton syndrome, or ALS.  It is also possible that as with any injection, there may be an allergic reaction or no effect from the medication. Reduced effectiveness after repeated injections is sometimes seen and rarely infection at the injection site may occur. All care will be taken to prevent these side effects. If therapy is given over a long time, atrophy and wasting in the muscle injected may occur. Occasionally the patient's become refractory to treatment because they develop antibodies to the toxin. In this event, therapy needs to be modified.  I have read the above information and consent to the administration of botulism toxin.    BOTOX PROCEDURE NOTE FOR MIGRAINE HEADACHE    Contraindications and precautions discussed with patient(above). Aseptic procedure was observed and patient tolerated procedure. Procedure performed by Dr. Georgia Dom  The condition has existed for more than 6 months, and pt does not have a diagnosis of ALS, Myasthenia  Gravis or Lambert-Eaton Syndrome.  Risks and benefits of injections discussed and pt agrees to proceed with the procedure.  Written consent obtained  These injections are medically necessary. Pt  receives good benefits from these injections. These injections do not cause sedations or hallucinations which the oral therapies may cause.  Description of procedure:  The patient was placed in a sitting position. The standard protocol was used for Botox as follows,  with 5 units of Botox injected at each site:   -Procerus muscle, midline injection  -Corrugator muscle, bilateral injection  -Frontalis muscle, bilateral injection, with 2 sites each side, medial injection was performed in the upper one third of the frontalis muscle, in the region vertical from the medial inferior edge of the superior orbital rim. The lateral injection was again in the upper one third of the forehead vertically above the lateral limbus of the cornea, 1.5 cm lateral to the medial injection site.  - Levator Scapulae: 5 units bilaterally  -Temporalis muscle injection, 5 sites, bilaterally. The first injection was 3 cm above the tragus of the ear, second injection site was 1.5 cm to 3 cm up from the first injection site in line with the tragus of the ear. The third injection site was 1.5-3 cm forward between the first 2 injection sites. The fourth injection site was 1.5 cm posterior to the second injection site. 5th site laterally in the temporalis  muscleat the level of the outer canthus.  - Patient feels her clenching is a trigger for headaches. +5 units masseter bilaterally   - Patient feels the migraines are centered around the eyes +5 units bilaterally at the outer canthus in the orbicularis occuli  -Occipitalis muscle injection, 3 sites, bilaterally. The first injection was done one half way between the occipital protuberance and the tip of the mastoid process behind the ear. The second injection site was done lateral and superior to the first, 1 fingerbreadth from the first injection. The third injection site was 1 fingerbreadth superiorly and medially from the first injection site.  -Cervical paraspinal muscle injection, 2 sites, bilateral knee first injection site was 1 cm from the midline of the cervical spine, 3 cm inferior to the lower border of the occipital protuberance. The second injection site was 1.5 cm superiorly and laterally to the first injection site.   -Trapezius muscle injection was performed at 3 sites, bilaterally. The first injection site was in the upper trapezius muscle halfway between the inflection point of the neck, and the acromion. The second injection site was one half way between the acromion and the first injection site. The third injection was done between the first injection site and the inflection point of the neck.   Will return for repeat injection in 3 months.   A 200 unit sof Botox was used, any Botox not injected was wasted. The patient tolerated the procedure well, there were no complications of the above procedure.

## 2018-12-06 NOTE — Progress Notes (Signed)
Botox- 100 units x 2 vials Lot: [id number] Expiration: 08/22 NDC: 9340-6840-33  Bacteriostatic 0.9% Sodium Chloride- 23mL total Lot: [id number] Expiration: 06/19/2019 NDC: 0992-7800-44  Dx: P15.806 S/P

## 2019-02-15 ENCOUNTER — Emergency Department (HOSPITAL_BASED_OUTPATIENT_CLINIC_OR_DEPARTMENT_OTHER)
Admission: EM | Admit: 2019-02-15 | Discharge: 2019-02-15 | Disposition: A | Discharge status: Home / Self Care | Payer: Medicaid Other | Attending: Emergency Medicine | Admitting: Emergency Medicine

## 2019-02-15 ENCOUNTER — Other Ambulatory Visit: Payer: Self-pay

## 2019-02-15 ENCOUNTER — Encounter (HOSPITAL_BASED_OUTPATIENT_CLINIC_OR_DEPARTMENT_OTHER): Payer: Self-pay | Admitting: *Deleted

## 2019-02-15 ENCOUNTER — Emergency Department (HOSPITAL_BASED_OUTPATIENT_CLINIC_OR_DEPARTMENT_OTHER): Payer: Medicaid Other

## 2019-02-15 DIAGNOSIS — Z79899 Other long term (current) drug therapy: Secondary | ICD-10-CM | POA: Diagnosis not present

## 2019-02-15 DIAGNOSIS — R110 Nausea: Secondary | ICD-10-CM | POA: Insufficient documentation

## 2019-02-15 DIAGNOSIS — F419 Anxiety disorder, unspecified: Secondary | ICD-10-CM | POA: Insufficient documentation

## 2019-02-15 DIAGNOSIS — R202 Paresthesia of skin: Secondary | ICD-10-CM | POA: Insufficient documentation

## 2019-02-15 DIAGNOSIS — R079 Chest pain, unspecified: Secondary | ICD-10-CM

## 2019-02-15 LAB — CBC
HCT: 41.3 % (ref 36.0–46.0)
Hemoglobin: 13.5 g/dL (ref 12.0–15.0)
MCH: 29.7 pg (ref 26.0–34.0)
MCHC: 32.7 g/dL (ref 30.0–36.0)
MCV: 91 fL (ref 80.0–100.0)
Platelets: 454 10*3/uL — ABNORMAL HIGH (ref 150–400)
RBC: 4.54 MIL/uL (ref 3.87–5.11)
RDW: 12.3 % (ref 11.5–15.5)
WBC: 8.5 10*3/uL (ref 4.0–10.5)
nRBC: 0 % (ref 0.0–0.2)

## 2019-02-15 LAB — BASIC METABOLIC PANEL
Anion gap: 9 (ref 5–15)
BUN: 12 mg/dL (ref 6–20)
CO2: 22 mmol/L (ref 22–32)
Calcium: 8.7 mg/dL — ABNORMAL LOW (ref 8.9–10.3)
Chloride: 108 mmol/L (ref 98–111)
Creatinine, Ser: 0.83 mg/dL (ref 0.44–1.00)
GFR calc Af Amer: >60 mL/min (ref >60)
GFR calc non Af Amer: >60 mL/min (ref >60)
Glucose, Bld: 98 mg/dL (ref 70–99)
Potassium: 3.4 mmol/L — ABNORMAL LOW (ref 3.5–5.1)
Sodium: 139 mmol/L (ref 135–145)

## 2019-02-15 LAB — TROPONIN I: Troponin I: <0.03 ng/mL (ref <0.03)

## 2019-02-15 LAB — MAGNESIUM: Magnesium: 2.4 mg/dL (ref 1.7–2.4)

## 2019-02-15 LAB — PREGNANCY, URINE: Preg Test, Ur: NEGATIVE

## 2019-02-15 MED ORDER — HYDROXYZINE HCL 10 MG PO TABS
10.0000 mg | ORAL_TABLET | Freq: Once | ORAL | Status: DC
  Filled 2019-02-15: qty 1

## 2019-02-15 MED ORDER — HYDROXYZINE HCL 25 MG PO TABS: 25.0000 mg | ORAL_TABLET | Freq: Four times a day (QID) | ORAL | 0 refills | Status: DC | PRN

## 2019-02-15 MED ORDER — ACETAMINOPHEN 325 MG PO TABS
650.0000 mg | ORAL_TABLET | Freq: Once | ORAL | Status: AC
  Administered 2019-02-15: 650 mg via ORAL
  Filled 2019-02-15: qty 2

## 2019-02-15 MED ORDER — HYDROXYZINE HCL 25 MG PO TABS
ORAL_TABLET | ORAL | Status: AC
  Administered 2019-02-15: 22:00:00
  Filled 2019-02-15: qty 1

## 2019-02-15 NOTE — ED Notes (Signed)
ED Provider at bedside. 

## 2019-02-15 NOTE — ED Notes (Signed)
Pt reports feeling shaky and her skin is burning. Pt denies SOB, chest pain.

## 2019-02-15 NOTE — Discharge Instructions (Signed)
You were seen in the emergency department today for chest pain. Your work-up in the emergency department has been overall reassuring. Your labs have been fairly normal and or similar to previous blood work you have had done.  Your potassium and your calcium were both slightly low, we have given you diet supplementation recommendations to help with this.  Your EKG and the enzyme we use to check your heart did not show an acute heart attack at this time. Your chest x-ray was normal.   We are sending you home with a prescription for Atarax to help with possible anxiety, please take this every 6 hours as needed for anxiety. We have prescribed you new medication(s) today. Discuss the medications prescribed today with your pharmacist as they can have adverse effects and interactions with your other medicines including over the counter and prescribed medications. Seek medical evaluation if you start to experience new or abnormal symptoms after taking one of these medicines, seek care immediately if you start to experience difficulty breathing, feeling of your throat closing, facial swelling, or rash as these could be indications of a more serious allergic reaction   We would like you to follow up closely with your primary care provider within 1-3 days. Return to the ER immediately should you experience any new or worsening symptoms including but not limited to return of pain, worsened pain, vomiting, shortness of breath, dizziness, lightheadedness, passing out, or any other concerns that you may have.

## 2019-02-15 NOTE — ED Provider Notes (Signed)
[facility] EMERGENCY DEPARTMENT Provider Note   CSN: [id number] Arrival date & time: 02/15/19  2022    History   Chief Complaint Chief Complaint  Patient presents with  . Chest Pain  . Anxiety    HPI Kelly Marsh is a 43 y.o. female with a hx of migraines who presents to the ED with complaints of chest discomfort that began intermittently yesterday, constant starting this AM. Patient notes discomfort is a pressure type pain to the diffuse anterior chest, initially brief episode lasting a few minutes several times per hour, now constant. Pain currently is an 8/10 in severity- no notable alleviating/aggravating factors. Sxs associated w/ anxiety & nausea. No intervention PTA. Patient notes she has been stressed lately. Denies SI, HI, or hallucinations. She also mentions a tingling sensation to her skin which is migratory- can occur to any arm or leg at any time lasting variable durations, not constant, no triggers- being followed by PCP for this. Denies numbness, weakness, slurred speech, facial droop, or headache. Denies dyspnea, leg pain/swelling, hemoptysis, recent surgery/trauma, recent long travel,  personal hx of cancer, or hx of DVT/PE. She does take OCP. Denies EtOH , tobacco, or drug use. No significant caffeine intake.     HPI  Past Medical History:  Diagnosis Date  . Migraine   . Snoring     Patient Active Problem List   Diagnosis Date Noted  . Chronic migraine without aura, with intractable migraine, so stated, with status migrainosus 12/06/2017  . Chronic migraine without aura without status migrainosus, not intractable 10/26/2015  . Migraine with aura and with status migrainosus, not intractable 04/13/2015  . Blurry vision, bilateral 04/13/2015  . Perceived hearing changes 04/13/2015  . Daily headache 04/13/2015    Past Surgical History:  Procedure Laterality Date  . NO PAST SURGERIES       OB History   No obstetric history on file.      Home  Medications    Prior to Admission medications   Medication Sig Start Date End Date Taking? Authorizing Provider  amoxicillin-clavulanate (AUGMENTIN) 875-125 MG tablet Take 1 tablet by mouth 2 (two) times daily.    [provider]  BOTOX 100 units SOLR injection INJECT 155 UNITS INTRAMUSCULARLY INTO HEAD AND NECK EVERY 3 MONTHS. 09/11/18   Melvenia Beam, MD  CAZIANT 0.1/0.125/0.15 -0.025 MG tablet TK 1 T PO D 02/26/15   [provider]  Cholecalciferol (VITAMIN D3) 1.25 MG (50000 UT) CAPS Take 1 capsule by mouth once a week.    [provider]  Erenumab-aooe (AIMOVIG) 38 MG/ML SOAJ Inject 1 pen into the skin every 30 (thirty) days. 07/16/18   Melvenia Beam, MD  fluconazole (DIFLUCAN) 200 MG tablet Take 200 mg by mouth daily.    [provider]  lisdexamfetamine (VYVANSE) 50 MG capsule Take 50 mg by mouth daily.    [provider]  naratriptan (AMERGE) 2.5 MG tablet Take one (1) tablet at onset of headache; if returns or does not resolve, may repeat after 2 hours; do not exceed five (5) mg in 24 hours. 09/06/17   Melvenia Beam, MD  phentermine 37.5 MG capsule Take 37.5 mg by mouth daily.  10/04/18   [provider]  Ubrogepant (UBRELVY) 50 MG TABS Take 100 mg by mouth every 2 (two) hours as needed. Max 200mg  a day 12/06/18   Melvenia Beam, MD    Family History Family History  Problem Relation Age of Onset  . Diabetes  Mother   . Cancer Maternal Grandmother   . Cancer Maternal Grandfather   . Migraines Neg Hx     Social History Social History   Tobacco Use  . Smoking status: Never Smoker  . Smokeless tobacco: Never Used  Substance Use Topics  . Alcohol use: No  . Drug use: No     Allergies   Patient has no known allergies.   Review of Systems Review of Systems  Constitutional: Negative for chills, diaphoresis and fever.  Respiratory: Negative for cough and shortness of breath.   Cardiovascular: Positive for chest  pain. Negative for leg swelling.  Gastrointestinal: Positive for nausea. Negative for abdominal pain, blood in stool, constipation, diarrhea and vomiting.  Neurological: Negative for dizziness, syncope, facial asymmetry, speech difficulty, weakness, light-headedness, numbness and headaches.       + for intermittent paresthesias in variable locations   Psychiatric/Behavioral: Negative for self-injury and suicidal ideas. The patient is nervous/anxious.   All other systems reviewed and are negative.    Physical Exam Updated Vital Signs BP (!) 142/106 (BP Location: Left Arm)   Pulse 97   Temp 98.3 F (36.8 C) (Oral)   Resp 18   Ht 5\' 6"  (1.676 m)   Wt 93.9 kg   LMP 02/06/2019 (Approximate)   SpO2 100%   BMI 33.41 kg/m   Physical Exam Vitals signs and nursing note reviewed.  Constitutional:      General: She is not in acute distress.    Appearance: She is well-developed. She is not toxic-appearing.  HENT:     Head: Normocephalic and atraumatic.  Eyes:     General:        Right eye: No discharge.        Left eye: No discharge.     Conjunctiva/sclera: Conjunctivae normal.  Neck:     Musculoskeletal: Neck supple.  Cardiovascular:     Rate and Rhythm: Normal rate and regular rhythm.     Pulses:          Radial pulses are 2+ on the right side and 2+ on the left side.  Pulmonary:     Effort: Pulmonary effort is normal. No respiratory distress.     Breath sounds: Normal breath sounds. No wheezing, rhonchi or rales.  Abdominal:     General: There is no distension.     Palpations: Abdomen is soft.     Tenderness: There is no abdominal tenderness.  Musculoskeletal:     Right lower leg: She exhibits no tenderness. No edema.     Left lower leg: She exhibits no tenderness. No edema.  Skin:    General: Skin is warm and dry.     Findings: No rash.  Neurological:     General: No focal deficit present.     Mental Status: She is alert.     Comments: Clear speech. CN III-XII grossly  intact. Sensation grossly intact x 4. Symmetric intact strength throughout. Ambulatory.   Psychiatric:        Behavior: Behavior normal.    ED Treatments / Results  Labs (all labs ordered are listed, but only abnormal results are displayed) Labs Reviewed  CBC - Abnormal; Notable for the following components:      Result Value   Platelets 454 (*)    All other components within normal limits  BASIC METABOLIC PANEL - Abnormal; Notable for the following components:   Potassium 3.4 (*)    Calcium 8.7 (*)    All other components within  normal limits  MAGNESIUM  TROPONIN I  PREGNANCY, URINE  TSH    EKG EKG Interpretation  Date/Time:  Saturday Feb 15 2019 22:00:42 EDT Ventricular Rate:  87 PR Interval:    QRS Duration: 79 QT Interval:  350 QTC Calculation: 421 R Axis:   62 Text Interpretation:  Sinus rhythm No significant change since last tracing Confirmed by Wandra Arthurs [id number]) on 02/15/2019 10:14:51 PM   Radiology Dg Chest 2 View  Result Date: 02/15/2019 CLINICAL DATA:  Chest pain EXAM: CHEST - 2 VIEW COMPARISON:  None. FINDINGS: The heart size and mediastinal contours are within normal limits. Both lungs are clear. The visualized skeletal structures are unremarkable. IMPRESSION: No active cardiopulmonary disease. Electronically Signed   By: Donavan Foil M.D.   On: 02/15/2019 22:16    Procedures Procedures (including critical care time)  Medications Ordered in ED Medications  hydrOXYzine (ATARAX/VISTARIL) tablet 10 mg (10 mg Oral Not Given 02/15/19 2203)  acetaminophen (TYLENOL) tablet 650 mg (650 mg Oral Given 02/15/19 2149)  hydrOXYzine (ATARAX/VISTARIL) 25 MG tablet (  Given 02/15/19 2203)     Initial Impression / Assessment and Plan / ED Course  I have reviewed the triage vital signs and the nursing notes.  Pertinent labs & imaging results that were available during my care of the patient were reviewed by me and considered in my medical decision making (see chart  for details).   Patient presents to the emergency department with complaints of chest discomfort that began yesterday intermittently, now constant today.  Discomfort is associated with anxiety and nausea.  She also mentions a tingling/burning sensation intermittently to her upper and lower extremities which is episodic, variable in location, and occurring for the past 2 months.  She is nontoxic-appearing, no apparent distress, vitals WNL with the exception of her elevated blood pressure, low suspicion for hypertensive emergency. DDX: ACS, pulmonary embolism, dissection, pneumothorax, effusion, infiltrate, arrhythmia, anemia, electrolyte derangement, MSK, GERD, anxiety, thyroid dysfunction. Evaluation initiated with labs, EKG, and CXR. Patient on cardiac monitor.   Work-up in the ER reviewed:  CBC: No leukocytosis/anemia. Mild elevation in platelets.  BMP: Hypokalemia/hypocalcemia- given how mild do not suspect this is contributory to her sxs- diet supplementation information provided. Renal function preserved.  Troponin: WNL after > 3 hours of sxs that have been constant today.  EKG: No significant change from prior, No STEMI CXR:  Negative, without infiltrate, effusion, pneumothorax, or fracture/dislocation.  TSH: Pending.   Low risk heart score, EKG without obvious ischemia, trop negative after > 3 hours of persistent pain, doubt ACS. Patient is low risk wells, doubt pulmonary embolism. Pain is not a tearing sensation, symmetric pulses, no widening of mediastinum on CXR, doubt dissection. Cardiac monitor reviewed, no notable arrhythmias or tachycardia. Patient has appeared hemodynamically stable throughout ER visit and appears safe for discharge with close PCP follow up. Unclear definitive etiology, she has had increased stress & pain is associated w/ anxiety therefore will trial atarax, no SI/HI/psychosis.. I discussed results, treatment plan, need for PCP follow-up, and return precautions with the  patient. Provided opportunity for questions, patient confirmed understanding and is in agreement with plan.   Final Clinical Impressions(s) / ED Diagnoses   Final diagnoses:  Chest pain, unspecified type    ED Discharge Orders         Ordered    hydrOXYzine (ATARAX/VISTARIL) 25 MG tablet  Every 6 hours PRN     02/15/19 2257  Amaryllis Dyke, PA-C 02/15/19 2316    Drenda Freeze, MD 02/16/19 [phone]

## 2019-02-15 NOTE — ED Triage Notes (Signed)
Pt reports feeling anxious for the last few days. Having "panic attack" off and  On. Denies SI/HI. States she has been having "skin burning" x 2 months and recently was tested by her PCP to see if she had been bitten by a tick

## 2019-02-16 LAB — TSH: TSH: 2.083 u[IU]/mL (ref 0.350–4.500)

## 2019-03-12 ENCOUNTER — Telehealth: Payer: Self-pay

## 2019-03-12 ENCOUNTER — Other Ambulatory Visit: Payer: Self-pay

## 2019-03-12 ENCOUNTER — Encounter: Payer: Self-pay | Admitting: Family Medicine

## 2019-03-12 ENCOUNTER — Ambulatory Visit: Payer: Medicaid Other | Admitting: Family Medicine

## 2019-03-12 DIAGNOSIS — G43709 Chronic migraine without aura, not intractable, without status migrainosus: Secondary | ICD-10-CM | POA: Diagnosis not present

## 2019-03-12 MED ORDER — UBRELVY 50 MG PO TABS: 50.0000 mg | ORAL_TABLET | Freq: Every day | ORAL | 11 refills | Status: DC | PRN

## 2019-03-12 NOTE — Telephone Encounter (Signed)
Pending approval for Ubrelvy 50 mg tablets. Form has been filled out and signed by NP. Faxed to [facility] at [phone]. Confirmation fax has been received. I will update once a decision has been made.

## 2019-03-12 NOTE — Progress Notes (Signed)
Made any corrections needed, and agree with procedure  Sarina Ill, MD [facility]

## 2019-03-12 NOTE — Progress Notes (Signed)
Update: She is doing well with Botox therapy. She reports > 50% reduction in headache frequency and severity. This is her 10th BOTOX treatment. She was able to get Ubrevly samples but has not received prescription. Will send today.    Consent Form Botulism Toxin Injection For Chronic Migraine    Reviewed orally with patient, additionally signature is on file:  Botulism toxin has been approved by the Federal drug administration for treatment of chronic migraine. Botulism toxin does not cure chronic migraine and it may not be effective in some patients.  The administration of botulism toxin is accomplished by injecting a small amount of toxin into the muscles of the neck and head. Dosage must be titrated for each individual. Any benefits resulting from botulism toxin tend to wear off after 3 months with a repeat injection required if benefit is to be maintained. Injections are usually done every 3-4 months with maximum effect peak achieved by about 2 or 3 weeks. Botulism toxin is expensive and you should be sure of what costs you will incur resulting from the injection.  The side effects of botulism toxin use for chronic migraine may include:   -Transient, and usually mild, facial weakness with facial injections  -Transient, and usually mild, head or neck weakness with head/neck injections  -Reduction or loss of forehead facial animation due to forehead muscle weakness  -Eyelid drooping  -Dry eye  -Pain at the site of injection or bruising at the site of injection  -Double vision  -Potential unknown long term risks   Contraindications: You should not have Botox if you are pregnant, nursing, allergic to albumin, have an infection, skin condition, or muscle weakness at the site of the injection, or have myasthenia gravis, Lambert-Eaton syndrome, or ALS.  It is also possible that as with any injection, there may be an allergic reaction or no effect from the medication. Reduced  effectiveness after repeated injections is sometimes seen and rarely infection at the injection site may occur. All care will be taken to prevent these side effects. If therapy is given over a long time, atrophy and wasting in the muscle injected may occur. Occasionally the patient's become refractory to treatment because they develop antibodies to the toxin. In this event, therapy needs to be modified.  I have read the above information and consent to the administration of botulism toxin.    BOTOX PROCEDURE NOTE FOR MIGRAINE HEADACHE  Contraindications and precautions discussed with patient(above). Aseptic procedure was observed and patient tolerated procedure. Procedure performed by Dr. Georgia Dom  The condition has existed for more than 6 months, and pt does not have a diagnosis of ALS, Myasthenia Gravis or Lambert-Eaton Syndrome.  Risks and benefits of injections discussed and pt agrees to proceed with the procedure.  Written consent obtained  These injections are medically necessary. Pt  receives good benefits from these injections. These injections do not cause sedations or hallucinations which the oral therapies may cause.   Description of procedure:  The patient was placed in a sitting position. The standard protocol was used for Botox as follows, with 5 units of Botox injected at each site:  -Procerus muscle, midline injection  -Corrugator muscle, bilateral injection  - Masseter muscle, bilateral injection   -Frontalis muscle, bilateral injection, with 2 sites each side, medial injection was performed in the upper one third of the frontalis muscle, in the region vertical from the medial inferior edge of the superior orbital rim. The lateral injection was again in  the upper one third of the forehead vertically above the lateral limbus of the cornea, 1.5 cm lateral to the medial injection site.  -Temporalis muscle injection, 4 sites, bilaterally. The first injection was 3 cm above  the tragus of the ear, second injection site was 1.5 cm to 3 cm up from the first injection site in line with the tragus of the ear. The third injection site was 1.5-3 cm forward between the first 2 injection sites. The fourth injection site was 1.5 cm posterior to the second injection site. 5th site laterally in the temporalis  muscleat the level of the outer canthus.  -Occipitalis muscle injection, 3 sites, bilaterally. The first injection was done one half way between the occipital protuberance and the tip of the mastoid process behind the ear. The second injection site was done lateral and superior to the first, 1 fingerbreadth from the first injection. The third injection site was 1 fingerbreadth superiorly and medially from the first injection site.  -Cervical paraspinal muscle injection, 2 sites, bilateral knee first injection site was 1 cm from the midline of the cervical spine, 3 cm inferior to the lower border of the occipital protuberance. The second injection site was 1.5 cm superiorly and laterally to the first injection site.  -Trapezius muscle injection was performed at 3 sites, bilaterally. The first injection site was in the upper trapezius muscle halfway between the inflection point of the neck, and the acromion. The second injection site was one half way between the acromion and the first injection site. The third injection was done between the first injection site and the inflection point of the neck.   Will return for repeat injection in 3 months.   A 165 units of Botox was used, any Botox not injected was wasted. The patient tolerated the procedure well, there were no complications of the above procedure.

## 2019-03-12 NOTE — Progress Notes (Signed)
Botox- 100 units x 2 vials Lot: [id number] Expiration: 10/2021 NDC: 9030-1499-69  Bacteriostatic 0.9% Sodium Chloride- 27mL total Lot: [id number] Expiration: 06/19/2019 NDC: 1991-4445-84  Dx: K35.075 SP

## 2019-05-07 ENCOUNTER — Ambulatory Visit (INDEPENDENT_AMBULATORY_CARE_PROVIDER_SITE_OTHER): Payer: Medicaid Other | Admitting: Neurology

## 2019-05-07 ENCOUNTER — Other Ambulatory Visit: Payer: Self-pay

## 2019-05-07 ENCOUNTER — Encounter: Payer: Self-pay | Admitting: Neurology

## 2019-05-07 VITALS — BP 134/94 | HR 83 | Temp 98.4°F | Ht 66.0 in | Wt 212.0 lb

## 2019-05-07 DIAGNOSIS — R202 Paresthesia of skin: Secondary | ICD-10-CM | POA: Diagnosis not present

## 2019-05-07 DIAGNOSIS — G43101 Migraine with aura, not intractable, with status migrainosus: Secondary | ICD-10-CM

## 2019-05-07 DIAGNOSIS — G43711 Chronic migraine without aura, intractable, with status migrainosus: Secondary | ICD-10-CM | POA: Diagnosis not present

## 2019-05-07 DIAGNOSIS — R29898 Other symptoms and signs involving the musculoskeletal system: Secondary | ICD-10-CM

## 2019-05-07 DIAGNOSIS — G5603 Carpal tunnel syndrome, bilateral upper limbs: Secondary | ICD-10-CM | POA: Diagnosis not present

## 2019-05-07 DIAGNOSIS — W19XXXA Unspecified fall, initial encounter: Secondary | ICD-10-CM

## 2019-05-07 DIAGNOSIS — R531 Weakness: Secondary | ICD-10-CM

## 2019-05-07 DIAGNOSIS — G441 Vascular headache, not elsewhere classified: Secondary | ICD-10-CM

## 2019-05-07 DIAGNOSIS — M6281 Muscle weakness (generalized): Secondary | ICD-10-CM

## 2019-05-07 DIAGNOSIS — R270 Ataxia, unspecified: Secondary | ICD-10-CM

## 2019-05-07 DIAGNOSIS — R269 Unspecified abnormalities of gait and mobility: Secondary | ICD-10-CM

## 2019-05-07 MED ORDER — TOPIRAMATE 100 MG PO TABS: 100.0000 mg | ORAL_TABLET | Freq: Every day | ORAL | 6 refills | Status: DC

## 2019-05-07 MED ORDER — AJOVY 225 MG/1.5ML ~~LOC~~ SOAJ: 1.5000 mL | SUBCUTANEOUS | 11 refills | Status: DC

## 2019-05-07 NOTE — Progress Notes (Signed)
[facility]    Provider:  Dr Jaynee Eagles Referring Provider: Simona Huh, NP Primary Care Physician:  Simona Huh, NP  CC: Chronic migraines  Interval history 05/07/2019: She is having more migraines. They started up again, stronger, every week she is having migraine and they can last 3-4 days, 2.5 months ago, No inciting events, maybe the masks we have been wearing, not sleeping well, wakes up often, she has always been like this and she has numbness in her fingers and numbness in the face the lower jaw and in different places like the forehead bilaterally, continuous and to her legs but her fingers always feel numb even when she is cooking or washing her hands. She wakes with numbness in the hands. Neck with radiation into the arms. weakness of the right hand, difficult walking her foot will rotate in and has had near falls. She has burning of the skin in the neck area as well. No vision changes, no bowel or bladder incontinence. She feels tremors.   HPI:  Kelly Marsh is a 43 y.o. female here as a referral from Dr. Leilani Merl for migraines, she in [place] and botox. She is having an increase in the migraines, last week had migraines 3 days in a row for example. Patient has significantly improved since starting Botox but continues to have monthly migraines that are refractory to acute management. We discussed all the options today and decided to try Fioricet only 10 times a month and discussed addiction, rebound headache possibility.  Tried: Topamax(side effects), Relpax, imitrex PO and injectable, Migrainal, cymbalta, robaxin, Frova(?), zembrace, propranolol(side effects), Nortriptyline, Inderal, maxalt(didn't help), zofran(haven't taken it in a while - reorderd), Imitrex(didn't work). She stopped taking the nortriptyline and the inderal 6 months ago now not on anything except botox for migraine. The botox has significantly helped her migraines by at least 50% frequency and decreased  severity. However she still continues to have burden of migraines every month and we'll try Aimovig.  Review of Systems: Patient complains of symptoms per HPI as well as the following symptoms: no rashes, fevers, +tremors,+headaches. Pertinent negatives and positives per HPI. All others negative.   Social History   Socioeconomic History  . Marital status: Married    Spouse name: Rhyen Mazariego   . Number of children: 5  . Years of education: 10  . Highest education level: Not on file  Occupational History  . Not on file  Social Needs  . Financial resource strain: Not on file  . Food insecurity    Worry: Not on file    Inability: Not on file  . Transportation needs    Medical: Not on file    Non-medical: Not on file  Tobacco Use  . Smoking status: Never Smoker  . Smokeless tobacco: Never Used  Substance and Sexual Activity  . Alcohol use: No  . Drug use: No  . Sexual activity: Not on file  Lifestyle  . Physical activity    Days per week: Not on file    Minutes per session: Not on file  . Stress: Not on file  Relationships  . Social Herbalist on phone: Not on file    Gets together: Not on file    Attends religious service: Not on file    Active member of club or organization: Not on file    Attends meetings of clubs or organizations: Not on file    Relationship status: Not on file  . Intimate partner violence  Fear of current or ex partner: Not on file    Emotionally abused: Not on file    Physically abused: Not on file    Forced sexual activity: Not on file  Other Topics Concern  . Not on file  Social History Narrative   Lives at home with husband and family   Caffeine use: 1 cup coffee per week    Family History  Problem Relation Age of Onset  . Diabetes Mother   . Cancer Maternal Grandmother   . Cancer Maternal Grandfather   . Migraines Neg Hx     Past Medical History:  Diagnosis Date  . Migraine   . Snoring     Past Surgical History:   Procedure Laterality Date  . NO PAST SURGERIES      Current Outpatient Medications  Medication Sig Dispense Refill  . BOTOX 100 units SOLR injection INJECT 155 UNITS INTRAMUSCULARLY INTO HEAD AND NECK EVERY 3 MONTHS. 2 each 2  . Cholecalciferol (VITAMIN D3) 1.25 MG (50000 UT) CAPS Take 1 capsule by mouth. Every other week    . desogestrel-ethinyl estradiol (VELIVET) 0.1/0.125/0.15 -0.025 MG tablet Take 1 tablet by mouth daily.    . Diethylpropion HCl CR 75 MG TB24 Take 1 tablet by mouth daily.    Marland Kitchen lisdexamfetamine (VYVANSE) 50 MG capsule Take 50 mg by mouth daily.    Marland Kitchen Ubrogepant (UBRELVY) 50 MG TABS Take 50 mg by mouth daily as needed. Take one tablet at onset of headache, may repeat 1 tablet in 2 hours, no more than 2 tablets in 24 hours 10 tablet 11  . Fremanezumab-vfrm (AJOVY) 225 MG/1.5ML SOAJ Inject 1.5 mLs into the skin every 30 (thirty) days. 1 pen 11  . topiramate (TOPAMAX) 100 MG tablet Take 1 tablet (100 mg total) by mouth daily. 90 tablet 6   No current facility-administered medications for this visit.     Allergies as of 05/07/2019  . (No Known Allergies)    Vitals: BP (!) 134/94 (BP Location: Right Arm, Patient Position: Sitting)   Pulse 83   Temp 98.4 F (36.9 C) Comment: taken by check-in staff  Ht 5\' 6"  (1.676 m)   Wt 212 lb (96.2 kg)   BMI 34.22 kg/m  Last Weight:  Wt Readings from Last 1 Encounters:  05/07/19 212 lb (96.2 kg)   Last Height:   Ht Readings from Last 1 Encounters:  05/07/19 5\' 6"  (1.676 m)    Physical exam: Exam: Gen: NAD, conversant, well nourised, obese, well groomed                     CV: RRR, no MRG. No Carotid Bruits. No peripheral edema, warm, nontender Eyes: Conjunctivae clear without exudates or hemorrhage  Neuro: Detailed Neurologic Exam  Speech:    Speech is normal; fluent and spontaneous with normal comprehension.  Cognition:    The patient is oriented to person, place, and time;     recent and remote memory intact;      language fluent;     normal attention, concentration,     fund of knowledge Cranial Nerves:    The pupils are equal, round, and reactive to light. The fundi are normal and spontaneous venous pulsations are present. Visual fields are full to finger confrontation. Extraocular movements are intact. Trigeminal sensation is intact and the muscles of mastication are normal. The face is symmetric. The palate elevates in the midline. Hearing intact. Voice is normal. Shoulder shrug is normal. The tongue  has normal motion without fasciculations.   Coordination:    Normal finger to nose and heel to shin. Normal rapid alternating movements.   Gait:    Heel-toe and tandem gait are normal.   Motor Observation:    No asymmetry, no atrophy, and no involuntary movements noted. Tone:    Normal muscle tone.    Posture:    Posture is normal. normal erect    Strength: left leg proximal weakness, otherwise strength is V/V in the upper and lower limbs.      Sensation: intact to LT     Reflex Exam:  DTR's:    Deep tendon reflexes in the upper and lower extremities are brisk bilaterally.   Toes:    The toes are downgoing bilaterally.   Clonus:    Clonus is absent.     Assessment/Plan:  Patient with intractable chronic migraines on Botox and CGRP treatments as failed multiple other treatments and has been refractory to multiple acute management medications.  MRI brain and cervical spine w/wo contrast to evaluate for chronic intractable headaches, tremors, especially evaluate for multiple sclerosis given multiple neurologic symptoms, paresthesias in the face, arms, legs, weakness, near falls, reported ataxia and gait abnormality +mcphalen's wrists - CTS needs emg/ncs Change to Ajovy since the Aimovig is becoming less effective likely immunogeniity  Orders Placed This Encounter  Procedures  . MR BRAIN W WO CONTRAST  . MR CERVICAL SPINE W WO CONTRAST  . Pregnancy, urine  . NCV with  EMG(electromyography)   Meds ordered this encounter  Medications  . topiramate (TOPAMAX) 100 MG tablet    Sig: Take 1 tablet (100 mg total) by mouth daily.    Dispense:  90 tablet    Refill:  6  . Fremanezumab-vfrm (AJOVY) 225 MG/1.5ML SOAJ    Sig: Inject 1.5 mLs into the skin every 30 (thirty) days.    Dispense:  1 pen    Refill:  11   Discussed:   There is increased risk for stroke in women with migraine with aura and a contraindication for the combined contraceptive pill for use by women who have migraine with aura. The risk for women with migraine without aura is lower. However other risk factors like smoking are far more likely to increase stroke risk than migraine. There is a recommendation for no smoking and for the use of OCPs without estrogen such as progestogen only pills particularly for women with migraine with aura.Marland Kitchen People who have migraine headaches with auras may be 3 times more likely to have a stroke caused by a blood clot, compared to migraine patients who don't see auras. Women who take hormone-replacement therapy may be 30 percent more likely to suffer a clot-based stroke than women not taking medication containing estrogen. Other risk factors like smoking and high blood pressure may be  much more important.   Sarina Ill, MD  [facility] [address] [place], [place] [address]  Phone [phone] Fax [phone]  A total of 30 minutes was spent face-to-face with this patient. Over half this time was spent on counseling patient on the  1. Chronic migraine without aura, with intractable migraine, so stated, with status migrainosus   2. Bilateral carpal tunnel syndrome   3. Paresthesia   4. Weakness   5. Fall, initial encounter   6. Gait abnormality   7. Ataxia   8. Weakness of right hand   9. Other vascular headache   10. Muscle weakness (generalized)   11. Migraine  with aura and with status migrainosus, not intractable     diagnosis and different diagnostic and therapeutic options, counseling and coordination of care, risks ans benefits of management, compliance, or risk factor reduction and education.

## 2019-05-07 NOTE — Patient Instructions (Addendum)
MRI brain and cervical spine w/wo contrast Change to [facility] [place] since the Aimovig is becoming less effective likely immunogeniity Emg/ncs for CTS Increase Topiramate Topiramate tablets What is this medicine? TOPIRAMATE (toe PYRE a mate) is used to treat seizures in adults or children with epilepsy. It is also used for the prevention of migraine headaches. This medicine may be used for other purposes; ask your health care provider or pharmacist if you have questions. COMMON BRAND NAME(S): Topamax, Topiragen What should I tell my health care provider before I take this medicine? They need to know if you have any of these conditions:  bleeding disorders  cirrhosis of the liver or liver disease  diarrhea  glaucoma  kidney stones or kidney disease  low blood counts, like low white cell, platelet, or red cell counts  lung disease like asthma, obstructive pulmonary disease, emphysema  metabolic acidosis  on a ketogenic diet  schedule for surgery or a procedure  suicidal thoughts, plans, or attempt; a previous suicide attempt by you or a family member  an unusual or allergic reaction to topiramate, other medicines, foods, dyes, or preservatives  pregnant or trying to get pregnant  breast-feeding How should I use this medicine? Take this medicine by mouth with a glass of water. Follow the directions on the prescription label. Do not crush or chew. You may take this medicine with meals. Take your medicine at regular intervals. Do not take it more often than directed. Talk to your pediatrician regarding the use of this medicine in children. Special care may be needed. While this drug may be prescribed for children as young as 50 years of age for selected conditions, precautions do apply. Overdosage: If you think you have taken too much of this medicine contact a poison control center or emergency room at once. NOTE: This medicine is only for you. Do not share this medicine with  others. What if I miss a dose? If you miss a dose, take it as soon as you can. If your next dose is to be taken in less than 6 hours, then do not take the missed dose. Take the next dose at your regular time. Do not take double or extra doses. What may interact with this medicine? Do not take this medicine with any of the following medications:  probenecid This medicine may also interact with the following medications:  acetazolamide  alcohol  amitriptyline  aspirin and aspirin-like medicines  birth control pills  certain medicines for depression  certain medicines for seizures  certain medicines that treat or prevent blood clots like warfarin, enoxaparin, dalteparin, apixaban, dabigatran, and rivaroxaban  digoxin  hydrochlorothiazide  lithium  medicines for pain, sleep, or muscle relaxation  metformin  methazolamide  NSAIDS, medicines for pain and inflammation, like ibuprofen or naproxen  pioglitazone  risperidone This list may not describe all possible interactions. Give your health care provider a list of all the medicines, herbs, non-prescription drugs, or dietary supplements you use. Also tell them if you smoke, drink alcohol, or use illegal drugs. Some items may interact with your medicine. What should I watch for while using this medicine? Visit your doctor or health care professional for regular checks on your progress. Do not stop taking this medicine suddenly. This increases the risk of seizures if you are using this medicine to control epilepsy. Wear a medical identification bracelet or chain to say you have epilepsy or seizures, and carry a card that lists all your medicines. This medicine can decrease sweating and  increase your body temperature. Watch for signs of deceased sweating or fever, especially in children. Avoid extreme heat, hot baths, and saunas. Be careful about exercising, especially in hot weather. Contact your health care provider right away if  you notice a fever or decrease in sweating. You should drink plenty of fluids while taking this medicine. If you have had kidney stones in the past, this will help to reduce your chances of forming kidney stones. If you have stomach pain, with nausea or vomiting and yellowing of your eyes or skin, call your doctor immediately. You may get drowsy, dizzy, or have blurred vision. Do not drive, use machinery, or do anything that needs mental alertness until you know how this medicine affects you. To reduce dizziness, do not sit or stand up quickly, especially if you are an older patient. Alcohol can increase drowsiness and dizziness. Avoid alcoholic drinks. If you notice blurred vision, eye pain, or other eye problems, seek medical attention at once for an eye exam. The use of this medicine may increase the chance of suicidal thoughts or actions. Pay special attention to how you are responding while on this medicine. Any worsening of mood, or thoughts of suicide or dying should be reported to your health care professional right away. This medicine may increase the chance of developing metabolic acidosis. If left untreated, this can cause kidney stones, bone disease, or slowed growth in children. Symptoms include breathing fast, fatigue, loss of appetite, irregular heartbeat, or loss of consciousness. Call your doctor immediately if you experience any of these side effects. Also, tell your doctor about any surgery you plan on having while taking this medicine since this may increase your risk for metabolic acidosis. Birth control pills may not work properly while you are taking this medicine. Talk to your doctor about using an extra method of birth control. Women who become pregnant while using this medicine may enroll in the [place] Pregnancy Registry by calling [phone]. This registry collects information about the safety of antiepileptic drug use during pregnancy. What side  effects may I notice from receiving this medicine? Side effects that you should report to your doctor or health care professional as soon as possible:  allergic reactions like skin rash, itching or hives, swelling of the face, lips, or tongue  decreased sweating and/or rise in body temperature  depression  difficulty breathing, fast or irregular breathing patterns  difficulty speaking  difficulty walking or controlling muscle movements  hearing impairment  redness, blistering, peeling or loosening of the skin, including inside the mouth  tingling, pain or numbness in the hands or feet  unusual bleeding or bruising  unusually weak or tired  worsening of mood, thoughts or actions of suicide or dying Side effects that usually do not require medical attention (report to your doctor or health care professional if they continue or are bothersome):  altered taste  back pain, joint or muscle aches and pains  diarrhea, or constipation  headache  loss of appetite  nausea  stomach upset, indigestion  tremors This list may not describe all possible side effects. Call your doctor for medical advice about side effects. You may report side effects to FDA at 1-800-FDA-1088. Where should I keep my medicine? Keep out of the reach of children. Store at room temperature between 15 and 30 degrees C (59 and 86 degrees F) in a tightly closed container. Protect from moisture. Throw away any unused medicine after the expiration date. NOTE: This sheet is  a summary. It may not cover all possible information. If you have questions about this medicine, talk to your doctor, pharmacist, or health care provider.  2020 Elsevier/Gold Standard (2013-09-08 23:17:57)  Carpal Tunnel Syndrome  Carpal tunnel syndrome is a condition that causes pain in your hand and arm. The carpal tunnel is a narrow area located on the palm side of your wrist. Repeated wrist motion or certain diseases may cause swelling  within the tunnel. This swelling pinches the main nerve in the wrist (median nerve). What are the causes? This condition may be caused by:  Repeated wrist motions.  Wrist injuries.  Arthritis.  A cyst or tumor in the carpal tunnel.  Fluid buildup during pregnancy. Sometimes the cause of this condition is not known. What increases the risk? The following factors may make you more likely to develop this condition:  Having a job, such as being a Research scientist (life sciences), that requires you to repeatedly move your wrist in the same motion.  Being a woman.  Having certain conditions, such as: ? Diabetes. ? Obesity. ? An underactive thyroid (hypothyroidism). ? Kidney failure. What are the signs or symptoms? Symptoms of this condition include:  A tingling feeling in your fingers, especially in your thumb, index, and middle fingers.  Tingling or numbness in your hand.  An aching feeling in your entire arm, especially when your wrist and elbow are bent for a long time.  Wrist pain that goes up your arm to your shoulder.  Pain that goes down into your palm or fingers.  A weak feeling in your hands. You may have trouble grabbing and holding items. Your symptoms may feel worse during the night. How is this diagnosed? This condition is diagnosed with a medical history and physical exam. You may also have tests, including:  Electromyogram (EMG). This test measures electrical signals sent by your nerves into the muscles.  Nerve conduction study. This test measures how well electrical signals pass through your nerves.  Imaging tests, such as X-rays, ultrasound, and MRI. These tests check for possible causes of your condition. How is this treated? This condition may be treated with:  Lifestyle changes. It is important to stop or change the activity that caused your condition.  Doing exercise and activities to strengthen your muscles and bones (physical therapy).  Learning how to use  your hand again after diagnosis (occupational therapy).  Medicines for pain and inflammation. This may include medicine that is injected into your wrist.  A wrist splint.  Surgery. Follow these instructions at home: If you have a splint:  Wear the splint as told by your health care provider. Remove it only as told by your health care provider.  Loosen the splint if your fingers tingle, become numb, or turn cold and blue.  Keep the splint clean.  If the splint is not waterproof: ? Do not let it get wet. ? Cover it with a watertight covering when you take a bath or shower. Managing pain, stiffness, and swelling   If directed, put ice on the painful area: ? If you have a removable splint, remove it as told by your health care provider. ? Put ice in a plastic bag. ? Place a towel between your skin and the bag. ? Leave the ice on for 20 minutes, 2-3 times per day. General instructions  Take over-the-counter and prescription medicines only as told by your health care provider.  Rest your wrist from any activity that may be causing your  pain. If your condition is work related, talk with your employer about changes that can be made, such as getting a wrist pad to use while typing.  Do any exercises as told by your health care provider, physical therapist, or occupational therapist.  Keep all follow-up visits as told by your health care provider. This is important. Contact a health care provider if:  You have new symptoms.  Your pain is not controlled with medicines.  Your symptoms get worse. Get help right away if:  You have severe numbness or tingling in your wrist or hand. Summary  Carpal tunnel syndrome is a condition that causes pain in your hand and arm.  It is usually caused by repeated wrist motions.  Lifestyle changes and medicines are used to treat carpal tunnel syndrome. Surgery may be recommended.  Follow your health care provider's instructions about wearing  a splint, resting from activity, keeping follow-up visits, and calling for help. This information is not intended to replace advice given to you by your health care provider. Make sure you discuss any questions you have with your health care provider. Document Released: 09/01/2000 Document Revised: 01/11/2018 Document Reviewed: 01/11/2018 Elsevier Patient Education  2020 [place] is a test to check how well your muscles and nerves are working. This procedure includes the combined use of electromyogram (EMG) and nerve conduction study (NCS). EMG is used to look for muscular disorders. NCS, which is also called electroneurogram, measures how well your nerves are controlling your muscles. The procedures are usually done together to check if your muscles and nerves are healthy. If the results of the tests are abnormal, this may indicate disease or injury, such as a neuromuscular disease or peripheral nerve damage. Tell a health care provider about:  Any allergies you have.  All medicines you are taking, including vitamins, herbs, eye drops, creams, and over-the-counter medicines.  Any problems you or family members have had with anesthetic medicines.  Any blood disorders you have.  Any surgeries you have had.  Any medical conditions you have.  If you have a pacemaker.  Whether you are pregnant or may be pregnant. What are the risks? Generally, this is a safe procedure. However, problems may occur, including:  Infection where the electrodes were inserted.  Bleeding. What happens before the procedure? Medicines Ask your health care provider about:  Changing or stopping your regular medicines. This is especially important if you are taking diabetes medicines or blood thinners.  Taking medicines such as aspirin and ibuprofen. These medicines can thin your blood. Do not take these medicines unless your health care provider tells you to take  them.  Taking over-the-counter medicines, vitamins, herbs, and supplements. General instructions  Your health care provider may ask you to avoid: ? Beverages that have caffeine, such as coffee and tea. ? Any products that contain nicotine or tobacco. These products include cigarettes, e-cigarettes, and chewing tobacco. If you need help quitting, ask your health care provider.  Do not use lotions or creams on the same day that you will be having the procedure. What happens during the procedure? For EMG   Your health care provider will ask you to stay in a position so that he or she can access the muscle that will be studied. You may be standing, sitting, or lying down.  You may be given a medicine that numbs the area (local anesthetic).  A very thin needle that has an electrode will be inserted into your muscle.  Another small electrode will be placed on your skin near the muscle.  Your health care provider will ask you to continue to remain still.  The electrodes will send a signal that tells about the electrical activity of your muscles. You may see this on a monitor or hear it in the room.  After your muscles have been studied at rest, your health care provider will ask you to contract or flex your muscles. The electrodes will send a signal that tells about the electrical activity of your muscles.  Your health care provider will remove the electrodes and the electrode needles when the procedure is finished. The procedure may vary among health care providers and hospitals. For NCS   An electrode that records your nerve activity (recording electrode) will be placed on your skin by the muscle that is being studied.  An electrode that is used as a reference (reference electrode) will be placed near the recording electrode.  A paste or gel will be applied to your skin between the recording electrode and the reference electrode.  Your nerve will be stimulated with a mild shock.  Your health care provider will measure how much time it takes for your muscle to react.  Your health care provider will remove the electrodes and the gel when the procedure is finished. The procedure may vary among health care providers and hospitals. What happens after the procedure?  It is up to you to get the results of your procedure. Ask your health care provider, or the department that is doing the procedure, when your results will be ready.  Your health care provider may: ? Give you medicines for any pain. ? Monitor the insertion sites to make sure that bleeding stops. Summary  Electromyoneurogram is a test to check how well your muscles and nerves are working.  If the results of the tests are abnormal, this may indicate disease or injury.  This is a safe procedure. However, problems may occur, such as bleeding and infection.  Your health care provider will do two tests to complete this procedure. One checks your muscles (EMG) and another checks your nerves (NCS).  It is up to you to get the results of your procedure. Ask your health care provider, or the department that is doing the procedure, when your results will be ready. This information is not intended to replace advice given to you by your health care provider. Make sure you discuss any questions you have with your health care provider. Document Released: 01/05/2005 Document Revised: 05/21/2018 Document Reviewed: 05/03/2018 Elsevier Patient Education  2020 [place] injection What is this medicine? FREMANEZUMAB (fre ma NEZ ue mab) is used to prevent migraine headaches. This medicine may be used for other purposes; ask your health care provider or pharmacist if you have questions. COMMON BRAND NAME(S): Kelly Marsh What should I tell my health care provider before I take this medicine? They need to know if you have any of these conditions:  an unusual or allergic reaction to fremanezumab, other medicines,  foods, dyes, or preservatives  pregnant or trying to get pregnant  breast-feeding How should I use this medicine? This medicine is for injection under the skin. You will be taught how to prepare and give this medicine. Use exactly as directed. Take your medicine at regular intervals. Do not take your medicine more often than directed. It is important that you put your used needles and syringes in a special sharps container. Do not put them in a  trash can. If you do not have a sharps container, call your pharmacist or healthcare provider to get one. Talk to your pediatrician regarding the use of this medicine in children. Special care may be needed. Overdosage: If you think you have taken too much of this medicine contact a poison control center or emergency room at once. NOTE: This medicine is only for you. Do not share this medicine with others. What if I miss a dose? If you miss a dose, take it as soon as you can. If it is almost time for your next dose, take only that dose. Do not take double or extra doses. What may interact with this medicine? Interactions are not expected. This list may not describe all possible interactions. Give your health care provider a list of all the medicines, herbs, non-prescription drugs, or dietary supplements you use. Also tell them if you smoke, drink alcohol, or use illegal drugs. Some items may interact with your medicine. What should I watch for while using this medicine? Tell your doctor or healthcare professional if your symptoms do not start to get better or if they get worse. What side effects may I notice from receiving this medicine? Side effects that you should report to your doctor or health care professional as soon as possible:  allergic reactions like skin rash, itching or hives, swelling of the face, lips, or tongue Side effects that usually do not require medical attention (report these to your doctor or health care professional if they  continue or are bothersome):  pain, redness, or irritation at site where injected This list may not describe all possible side effects. Call your doctor for medical advice about side effects. You may report side effects to FDA at 1-800-FDA-1088. Where should I keep my medicine? Keep out of the reach of children. You will be instructed on how to store this medicine. Throw away any unused medicine after the expiration date on the label. NOTE: This sheet is a summary. It may not cover all possible information. If you have questions about this medicine, talk to your doctor, pharmacist, or health care provider.  2020 Elsevier/Gold Standard (2017-06-04 17:22:56)

## 2019-05-08 LAB — PREGNANCY, URINE: Preg Test, Ur: NEGATIVE

## 2019-05-12 ENCOUNTER — Ambulatory Visit: Payer: Medicaid Other | Admitting: Family Medicine

## 2019-05-12 ENCOUNTER — Telehealth: Payer: Self-pay | Admitting: Neurology

## 2019-05-12 NOTE — Telephone Encounter (Signed)
Medicaid order sent to GI. They will obtain the auth and reach out to the patient to schedule.  

## 2019-05-14 ENCOUNTER — Telehealth: Payer: Self-pay | Admitting: *Deleted

## 2019-05-14 ENCOUNTER — Encounter: Payer: Self-pay | Admitting: *Deleted

## 2019-05-14 NOTE — Telephone Encounter (Signed)
Completed [facility] on [employer]. Med approved immediately.   Confirmation #: [id number] W Prior Approval #[id number] Status: APPROVED

## 2019-06-09 ENCOUNTER — Other Ambulatory Visit: Payer: Medicaid Other

## 2019-06-17 ENCOUNTER — Ambulatory Visit: Payer: Medicaid Other | Admitting: Neurology

## 2019-06-17 ENCOUNTER — Other Ambulatory Visit: Payer: Self-pay

## 2019-06-17 VITALS — Temp 98.6°F

## 2019-06-17 DIAGNOSIS — G43711 Chronic migraine without aura, intractable, with status migrainosus: Secondary | ICD-10-CM | POA: Diagnosis not present

## 2019-06-17 MED ORDER — METHYLPREDNISOLONE 4 MG PO TBPK: ORAL_TABLET | ORAL | 1 refills | Status: DC

## 2019-06-17 NOTE — Progress Notes (Addendum)
Consent Form Botulism Toxin Injection For Chronic Migraine  Interval history 06/17/2019: has a tooth infection and getting more migraines since then but still she has done exceptionally well with this treatment. >50% reduction in frequency and severity of migraines and headaches since starting botox. Patient has tried multiple acute meds.  Reviewed orally with patient, additionally signature is on file:  Botulism toxin has been approved by the Federal drug administration for treatment of chronic migraine. Botulism toxin does not cure chronic migraine and it may not be effective in some patients.  The administration of botulism toxin is accomplished by injecting a small amount of toxin into the muscles of the neck and head. Dosage must be titrated for each individual. Any benefits resulting from botulism toxin tend to wear off after 3 months with a repeat injection required if benefit is to be maintained. Injections are usually done every 3-4 months with maximum effect peak achieved by about 2 or 3 weeks. Botulism toxin is expensive and you should be sure of what costs you will incur resulting from the injection.  The side effects of botulism toxin use for chronic migraine may include:   -Transient, and usually mild, facial weakness with facial injections  -Transient, and usually mild, head or neck weakness with head/neck injections  -Reduction or loss of forehead facial animation due to forehead muscle weakness  -Eyelid drooping  -Dry eye  -Pain at the site of injection or bruising at the site of injection  -Double vision  -Potential unknown long term risks  Contraindications: You should not have Botox if you are pregnant, nursing, allergic to albumin, have an infection, skin condition, or muscle weakness at the site of the injection, or have myasthenia gravis, Lambert-Eaton syndrome, or ALS.  It is also possible that as with any injection, there may be an allergic reaction or no effect  from the medication. Reduced effectiveness after repeated injections is sometimes seen and rarely infection at the injection site may occur. All care will be taken to prevent these side effects. If therapy is given over a long time, atrophy and wasting in the muscle injected may occur. Occasionally the patient's become refractory to treatment because they develop antibodies to the toxin. In this event, therapy needs to be modified.  I have read the above information and consent to the administration of botulism toxin.    BOTOX PROCEDURE NOTE FOR MIGRAINE HEADACHE    Contraindications and precautions discussed with patient(above). Aseptic procedure was observed and patient tolerated procedure. Procedure performed by Dr. Georgia Dom  The condition has existed for more than 6 months, and pt does not have a diagnosis of ALS, Myasthenia Gravis or Lambert-Eaton Syndrome.  Risks and benefits of injections discussed and pt agrees to proceed with the procedure.  Written consent obtained  These injections are medically necessary. Pt  receives good benefits from these injections. These injections do not cause sedations or hallucinations which the oral therapies may cause.  Description of procedure:  The patient was placed in a sitting position. The standard protocol was used for Botox as follows, with 5 units of Botox injected at each site:   -Procerus muscle, midline injection  -Corrugator muscle, bilateral injection  -Frontalis muscle, bilateral injection, with 2 sites each side, medial injection was performed in the upper one third of the frontalis muscle, in the region vertical from the medial inferior edge of the superior orbital rim. The lateral injection was again in the upper one third of the forehead vertically  above the lateral limbus of the cornea, 1.5 cm lateral to the medial injection site.  - Levator Scapulae: 5 units bilaterally  -Temporalis muscle injection, 5 sites, bilaterally. The  first injection was 3 cm above the tragus of the ear, second injection site was 1.5 cm to 3 cm up from the first injection site in line with the tragus of the ear. The third injection site was 1.5-3 cm forward between the first 2 injection sites. The fourth injection site was 1.5 cm posterior to the second injection site. 5th site laterally in the temporalis  muscleat the level of the outer canthus.  - Patient feels her clenching is a trigger for headaches. +5 units masseter bilaterally   - Patient feels the migraines are centered around the eyes +5 units bilaterally at the outer canthus in the orbicularis occuli  -Occipitalis muscle injection, 3 sites, bilaterally. The first injection was done one half way between the occipital protuberance and the tip of the mastoid process behind the ear. The second injection site was done lateral and superior to the first, 1 fingerbreadth from the first injection. The third injection site was 1 fingerbreadth superiorly and medially from the first injection site.  -Cervical paraspinal muscle injection, 2 sites, bilateral knee first injection site was 1 cm from the midline of the cervical spine, 3 cm inferior to the lower border of the occipital protuberance. The second injection site was 1.5 cm superiorly and laterally to the first injection site.  -Trapezius muscle injection was performed at 3 sites, bilaterally. The first injection site was in the upper trapezius muscle halfway between the inflection point of the neck, and the acromion. The second injection site was one half way between the acromion and the first injection site. The third injection was done between the first injection site and the inflection point of the neck.   Will return for repeat injection in 3 months.   A 200 unit sof Botox was used, any Botox not injected was wasted. The patient tolerated the procedure well, there were no complications of the above procedure.

## 2019-06-17 NOTE — Progress Notes (Signed)
Botox- 100 units x 2 vials Lot: [id number] Expiration: 12/2021 NDC: [id number]  Bacteriostatic 0.9% Sodium Chloride- 75mL total Lot: [id number] Expiration: 06/19/2019 NDC: [id number]  Dx: [id number] S/P

## 2019-06-17 NOTE — Patient Instructions (Signed)
Methylprednisolone tablets What is this medicine? METHYLPREDNISOLONE (meth ill pred NISS oh lone) is a corticosteroid. It is commonly used to treat inflammation of the skin, joints, lungs, and other organs. Common conditions treated include asthma, allergies, and arthritis. It is also used for other conditions, such as blood disorders and diseases of the adrenal glands. This medicine may be used for other purposes; ask your health care provider or pharmacist if you have questions. COMMON BRAND NAME(S): Medrol, Medrol Dosepak What should I tell my health care provider before I take this medicine? They need to know if you have any of these conditions:  Cushing's syndrome  eye disease, vision problems  diabetes  glaucoma  heart disease  high blood pressure  infection (especially a virus infection such as chickenpox, cold sores, or herpes)  liver disease  mental illness  myasthenia gravis  osteoporosis  recently received or scheduled to receive a vaccine  seizures  stomach or intestine problems  thyroid disease  an unusual or allergic reaction to lactose, methylprednisolone, other medicines, foods, dyes, or preservatives  pregnant or trying to get pregnant  breast-feeding How should I use this medicine? Take this medicine by mouth with a glass of water. Follow the directions on the prescription label. Take this medicine with food. If you are taking this medicine once a day, take it in the morning. Do not take it more often than directed. Do not suddenly stop taking your medicine because you may develop a severe reaction. Your doctor will tell you how much medicine to take. If your doctor wants you to stop the medicine, the dose may be slowly lowered over time to avoid any side effects. Talk to your pediatrician regarding the use of this medicine in children. Special care may be needed. Overdosage: If you think you have taken too much of this medicine contact a poison control  center or emergency room at once. NOTE: This medicine is only for you. Do not share this medicine with others. What if I miss a dose? If you miss a dose, take it as soon as you can. If it is almost time for your next dose, talk to your doctor or health care professional. You may need to miss a dose or take an extra dose. Do not take double or extra doses without advice. What may interact with this medicine? Do not take this medicine with any of the following medications:  alefacept  echinacea  live virus vaccines  metyrapone  mifepristone This medicine may also interact with the following medications:  amphotericin B  aspirin and aspirin-like medicines  certain antibiotics like erythromycin, clarithromycin, troleandomycin  certain medicines for diabetes  certain medicines for fungal infections like ketoconazole  certain medicines for seizures like carbamazepine, phenobarbital, phenytoin  certain medicines that treat or prevent blood clots like warfarin  cholestyramine  cyclosporine  digoxin  diuretics  female hormones, like estrogens and birth control pills  isoniazid  NSAIDs, medicines for pain inflammation, like ibuprofen or naproxen  other medicines for myasthenia gravis  rifampin  vaccines This list may not describe all possible interactions. Give your health care provider a list of all the medicines, herbs, non-prescription drugs, or dietary supplements you use. Also tell them if you smoke, drink alcohol, or use illegal drugs. Some items may interact with your medicine. What should I watch for while using this medicine? Tell your doctor or healthcare professional if your symptoms do not start to get better or if they get worse. Do not stop  taking except on your doctor's advice. You may develop a severe reaction. Your doctor will tell you how much medicine to take. This medicine may increase your risk of getting an infection. Tell your doctor or health care  professional if you are around anyone with measles or chickenpox, or if you develop sores or blisters that do not heal properly. This medicine may increase blood sugar levels. Ask your healthcare provider if changes in diet or medicines are needed if you have diabetes. Tell your doctor or health care professional right away if you have any change in your eyesight. Using this medicine for a long time may increase your risk of low bone mass. Talk to your doctor about bone health. What side effects may I notice from receiving this medicine? Side effects that you should report to your doctor or health care professional as soon as possible:  allergic reactions like skin rash, itching or hives, swelling of the face, lips, or tongue  bloody or tarry stools  hallucination, loss of contact with reality  muscle cramps  muscle pain  palpitations  signs and symptoms of high blood sugar such as being more thirsty or hungry or having to urinate more than normal. You may also feel very tired or have blurry vision.  signs and symptoms of infection like fever or chills; cough; sore throat; pain or trouble passing urine Side effects that usually do not require medical attention (report to your doctor or health care professional if they continue or are bothersome):  changes in emotions or mood  constipation  diarrhea  excessive hair growth on the face or body  headache  nausea, vomiting  trouble sleeping  weight gain This list may not describe all possible side effects. Call your doctor for medical advice about side effects. You may report side effects to FDA at 1-800-FDA-1088. Where should I keep my medicine? Keep out of the reach of children. Store at room temperature between 20 and 25 degrees C (68 and 77 degrees F). Throw away any unused medicine after the expiration date. NOTE: This sheet is a summary. It may not cover all possible information. If you have questions about this medicine,  talk to your doctor, pharmacist, or health care provider.  2020 [employer] Standard (2018-06-06 09:19:36)

## 2019-06-17 NOTE — Addendum Note (Signed)
Addended by: Sarina Ill B on: 06/17/2019 02:55 PM   Modules accepted: Orders

## 2019-06-26 ENCOUNTER — Encounter: Payer: Medicaid Other | Admitting: Neurology

## 2019-06-27 ENCOUNTER — Other Ambulatory Visit: Payer: Medicaid Other

## 2019-07-12 ENCOUNTER — Other Ambulatory Visit: Payer: Self-pay

## 2019-07-12 ENCOUNTER — Ambulatory Visit
Admission: RE | Admit: 2019-07-12 | Discharge: 2019-07-12 | Disposition: A | Discharge status: Home / Self Care | Payer: Medicaid Other | Source: Ambulatory Visit | Attending: Neurology | Admitting: Neurology

## 2019-07-12 DIAGNOSIS — R270 Ataxia, unspecified: Secondary | ICD-10-CM

## 2019-07-12 DIAGNOSIS — R531 Weakness: Secondary | ICD-10-CM

## 2019-07-12 DIAGNOSIS — M6281 Muscle weakness (generalized): Secondary | ICD-10-CM

## 2019-07-12 DIAGNOSIS — W19XXXA Unspecified fall, initial encounter: Secondary | ICD-10-CM

## 2019-07-12 DIAGNOSIS — R202 Paresthesia of skin: Secondary | ICD-10-CM

## 2019-07-12 DIAGNOSIS — R29898 Other symptoms and signs involving the musculoskeletal system: Secondary | ICD-10-CM

## 2019-07-12 DIAGNOSIS — R269 Unspecified abnormalities of gait and mobility: Secondary | ICD-10-CM

## 2019-07-12 DIAGNOSIS — G441 Vascular headache, not elsewhere classified: Secondary | ICD-10-CM

## 2019-07-12 MED ORDER — GADOBENATE DIMEGLUMINE 529 MG/ML IV SOLN
20.0000 mL | Freq: Once | INTRAVENOUS | Status: AC | PRN
  Administered 2019-07-12: 14:00:00 20 mL via INTRAVENOUS

## 2019-08-29 ENCOUNTER — Telehealth: Payer: Self-pay | Admitting: Family Medicine

## 2019-08-29 NOTE — Telephone Encounter (Signed)
Tracey with [facility] called to schedule delivery for the patients botox please follow up.

## 2019-09-10 ENCOUNTER — Encounter: Payer: Self-pay | Admitting: Family Medicine

## 2019-09-10 ENCOUNTER — Other Ambulatory Visit: Payer: Self-pay

## 2019-09-10 ENCOUNTER — Ambulatory Visit: Payer: Medicaid Other | Admitting: Family Medicine

## 2019-09-10 DIAGNOSIS — G43709 Chronic migraine without aura, not intractable, without status migrainosus: Secondary | ICD-10-CM | POA: Diagnosis not present

## 2019-09-10 NOTE — Progress Notes (Signed)
Consent Form Botulism Toxin Injection For Chronic Migraine    Reviewed orally with patient, additionally signature is on file:  Botulism toxin has been approved by the Federal drug administration for treatment of chronic migraine. Botulism toxin does not cure chronic migraine and it may not be effective in some patients.  The administration of botulism toxin is accomplished by injecting a small amount of toxin into the muscles of the neck and head. Dosage must be titrated for each individual. Any benefits resulting from botulism toxin tend to wear off after 3 months with a repeat injection required if benefit is to be maintained. Injections are usually done every 3-4 months with maximum effect peak achieved by about 2 or 3 weeks. Botulism toxin is expensive and you should be sure of what costs you will incur resulting from the injection.  The side effects of botulism toxin use for chronic migraine may include:   -Transient, and usually mild, facial weakness with facial injections  -Transient, and usually mild, head or neck weakness with head/neck injections  -Reduction or loss of forehead facial animation due to forehead muscle weakness  -Eyelid drooping  -Dry eye  -Pain at the site of injection or bruising at the site of injection  -Double vision  -Potential unknown long term risks   Contraindications: You should not have Botox if you are pregnant, nursing, allergic to albumin, have an infection, skin condition, or muscle weakness at the site of the injection, or have myasthenia gravis, Lambert-Eaton syndrome, or ALS.  It is also possible that as with any injection, there may be an allergic reaction or no effect from the medication. Reduced effectiveness after repeated injections is sometimes seen and rarely infection at the injection site may occur. All care will be taken to prevent these side effects. If therapy is given over a long time, atrophy and wasting in the muscle injected may  occur. Occasionally the patient's become refractory to treatment because they develop antibodies to the toxin. In this event, therapy needs to be modified.  I have read the above information and consent to the administration of botulism toxin.    BOTOX PROCEDURE NOTE FOR MIGRAINE HEADACHE  Contraindications and precautions discussed with patient(above). Aseptic procedure was observed and patient tolerated procedure. Procedure performed by Debbora Presto, FNP-C.   The condition has existed for more than 6 months, and pt does not have a diagnosis of ALS, Myasthenia Gravis or Lambert-Eaton Syndrome.  Risks and benefits of injections discussed and pt agrees to proceed with the procedure.  Written consent obtained  These injections are medically necessary. Pt  receives good benefits from these injections. These injections do not cause sedations or hallucinations which the oral therapies may cause.   Description of procedure:  The patient was placed in a sitting position. The standard protocol was used for Botox as follows, with 5 units of Botox injected at each site:  -Procerus muscle, midline injection  -Corrugator muscle, bilateral injection  -masseter muscle bilaterally  -Frontalis muscle, bilateral injection, with 2 sites each side, medial injection was performed in the upper one third of the frontalis muscle, in the region vertical from the medial inferior edge of the superior orbital rim. The lateral injection was again in the upper one third of the forehead vertically above the lateral limbus of the cornea, 1.5 cm lateral to the medial injection site.  -Temporalis muscle injection, 4 sites, bilaterally. The first injection was 3 cm above the tragus of the ear, second injection site  was 1.5 cm to 3 cm up from the first injection site in line with the tragus of the ear. The third injection site was 1.5-3 cm forward between the first 2 injection sites. The fourth injection site was 1.5 cm  posterior to the second injection site. 5th site laterally in the temporalis  muscleat the level of the outer canthus.  -Occipitalis muscle injection, 3 sites, bilaterally. The first injection was done one half way between the occipital protuberance and the tip of the mastoid process behind the ear. The second injection site was done lateral and superior to the first, 1 fingerbreadth from the first injection. The third injection site was 1 fingerbreadth superiorly and medially from the first injection site.  -Cervical paraspinal muscle injection, 2 sites, bilateral knee first injection site was 1 cm from the midline of the cervical spine, 3 cm inferior to the lower border of the occipital protuberance. The second injection site was 1.5 cm superiorly and laterally to the first injection site.  -Trapezius muscle injection was performed at 3 sites, bilaterally. The first injection site was in the upper trapezius muscle halfway between the inflection point of the neck, and the acromion. The second injection site was one half way between the acromion and the first injection site. The third injection was done between the first injection site and the inflection point of the neck.   Will return for repeat injection in 3 months.   A 165 units of Botox was used, any Botox not injected was wasted. The patient tolerated the procedure well, there were no complications of the above procedure.

## 2019-09-10 NOTE — Progress Notes (Signed)
BOTOX 100units x2.  [facility]  LOT [id number],  Exp. 04/2022.

## 2019-11-12 ENCOUNTER — Other Ambulatory Visit: Payer: Self-pay | Admitting: Neurology

## 2019-12-11 NOTE — Progress Notes (Signed)
Office Visit Note  Patient: Kelly Marsh             Date of Birth: 08/19/1976           MRN: [id number]             PCP: Simona Huh, NP Referring: Simona Huh, NP Visit Date: 12/15/2019 Occupation: '@GUAROCC' @  Subjective:  Pain in joints and +ANA.   History of Present Illness: Kelly Marsh is a 44 y.o. female consultation per request of her PCP.  According to patient her symptoms started about 5 years ago with pain in her ankles and the top of her feet off and on.  She also has been experiencing discomfort in her knee joints for the last 2 years especially when she exercises.  She also describes some discomfort around the nape of her neck.  She states sometimes the spasms can cause migraines.  She has tingling sensation in her hands and also a burning sensation over her neck which is comes and goes.  She is also noticed that she has increased pain when she bumps into some objects.  She been experiencing increased fatigue after exercise.  She denies any history of joint swelling.  There is no family history of autoimmune disease.  She denies any history of rash.  There is no history of oral ulcers, nasal ulcers, malar rash, photosensitivity, Raynaud's phenomenon or lymphadenopathy.  She gives history of dry mouth and dry eyes.  Activities of Daily Living:  Patient reports morning stiffness for 3-4 hours.   Patient Reports nocturnal pain.  Difficulty dressing/grooming: Denies Difficulty climbing stairs: Denies Difficulty getting out of chair: Denies Difficulty using hands for taps, buttons, cutlery, and/or writing: Denies  Review of Systems  Constitutional: Positive for fatigue. Negative for night sweats, weight gain and weight loss.  HENT: Positive for mouth dryness. Negative for mouth sores, trouble swallowing, trouble swallowing and nose dryness.   Eyes: Positive for pain, redness and dryness. Negative for visual disturbance.  Respiratory: Positive for shortness of breath. Negative  for cough and difficulty breathing.   Cardiovascular: Negative for chest pain, palpitations, hypertension, irregular heartbeat and swelling in legs/feet.  Gastrointestinal: Negative for blood in stool, constipation and diarrhea.  Endocrine: Negative for increased urination.  Genitourinary: Negative for difficulty urinating and vaginal dryness.  Musculoskeletal: Positive for arthralgias, joint pain, myalgias, morning stiffness, muscle tenderness and myalgias. Negative for joint swelling and muscle weakness.  Skin: Positive for hair loss. Negative for color change, rash, redness, skin tightness, ulcers and sensitivity to sunlight.  Allergic/Immunologic: Negative for susceptible to infections.  Neurological: Positive for numbness, headaches and memory loss. Negative for dizziness, night sweats and weakness.  Hematological: Negative for bruising/bleeding tendency and swollen glands.  Psychiatric/Behavioral: Positive for sleep disturbance. Negative for depressed mood and confusion. The patient is nervous/anxious.     PMFS History:  Patient Active Problem List   Diagnosis Date Noted  . Chronic migraine without aura, with intractable migraine, so stated, with status migrainosus 12/06/2017  . Chronic migraine without aura without status migrainosus, not intractable 10/26/2015  . Migraine with aura and with status migrainosus, not intractable 04/13/2015  . Blurry vision, bilateral 04/13/2015  . Perceived hearing changes 04/13/2015  . Daily headache 04/13/2015    Past Medical History:  Diagnosis Date  . Fatigue   . Migraine   . Snoring   . Vitamin B12 deficiency   . Vitamin D deficiency     Family History  Problem Relation Age of Onset  .  Diabetes Mother   . Cancer Maternal Grandmother   . Cancer Maternal Grandfather   . Diabetes Sister   . Diabetes Brother   . Cancer Brother   . Healthy Daughter   . Healthy Son   . Healthy Son   . Healthy Son   . OCD Son   . Healthy Son   .  Migraines Neg Hx    Past Surgical History:  Procedure Laterality Date  . NO PAST SURGERIES     Social History   Social History Narrative   Lives at home with husband and family   Caffeine use: 1 cup coffee per week    There is no immunization history on file for this patient.   Objective: Vital Signs: BP (!) 140/97 (BP Location: Right Arm, Patient Position: Sitting, Cuff Size: Normal)   Pulse 84   Resp 16   Ht '5\' 6"'  (1.676 m)   Wt 225 lb (102.1 kg)   BMI 36.32 kg/m    Physical Exam Vitals and nursing note reviewed.  Constitutional:      Appearance: She is well-developed.  HENT:     Head: Normocephalic and atraumatic.  Eyes:     Conjunctiva/sclera: Conjunctivae normal.  Cardiovascular:     Rate and Rhythm: Normal rate and regular rhythm.     Heart sounds: Normal heart sounds.  Pulmonary:     Effort: Pulmonary effort is normal.     Breath sounds: Normal breath sounds.  Abdominal:     General: Bowel sounds are normal.     Palpations: Abdomen is soft.  Musculoskeletal:     Cervical back: Normal range of motion.  Lymphadenopathy:     Cervical: No cervical adenopathy.  Skin:    General: Skin is warm and dry.     Capillary Refill: Capillary refill takes less than 2 seconds.  Neurological:     Mental Status: She is alert and oriented to person, place, and time.  Psychiatric:        Behavior: Behavior normal.      Musculoskeletal Exam: Patient has some discomfort range of motion of her cervical spine.  She has tenderness over bilateral trapezius area.  She had good range of motion of her shoulder joints, elbow joints, wrist joints, MCPs PIPs and DIPs with no synovitis.  She had good range of motion of her hip joints, knee joints, ankles, MTPs and PIPs.  She has bilateral dorsal spurs and bilateral pes cavus.  CDAI Exam: CDAI Score: - Patient Global: -; Provider Global: - Swollen: -; Tender: - Joint Exam 12/15/2019   No joint exam has been documented for this  visit   There is currently no information documented on the homunculus. Go to the Rheumatology activity and complete the homunculus joint exam.  Investigation: No additional findings.  Imaging: XR Cervical Spine 2 or 3 views  Result Date: 12/15/2019 No disc space narrowing was noted.  No anterior posterior spurring was noted.  No facet joint arthropathy was noted. Impression: Unremarkable x-ray of the cervical spine.  XR Foot 2 Views Left  Result Date: 12/15/2019 PIP and DIP narrowing was noted.  First MTP narrowing was noted.  Dorsal spurring was noted.  No intertarsal tibiotalar joint space narrowing was noted.  Inferior calcaneal spur was noted. Impression: These findings are consistent with osteoarthritis of the foot.  XR Foot 2 Views Right  Result Date: 12/15/2019 PIP and DIP narrowing was noted.  No MTP, intertarsal or tibiotalar joint space narrowing was noted.  A  small calcaneal spur was noted. 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  XR KNEE 3 VIEW LEFT  Result Date: 12/15/2019 Mild patellofemoral narrowing was noted.  Mild medial compartment narrowing was noted.  No chondrocalcinosis was noted. Impression: These findings are consistent mild osteoarthritis and mild chondromalacia patella.  XR KNEE 3 VIEW RIGHT  Result Date: 12/15/2019 Moderate patellofemoral narrowing was noted.  Mild lateral compartment narrowing with lateral osteophytes and intercondylar osteophytes was noted.  No chondrocalcinosis was noted. Impression: These findings are consistent with mild osteoarthritis and moderate chondromalacia patella.   Recent Labs: Lab Results  Component Value Date   WBC 8.5 02/15/2019   HGB 13.5 02/15/2019   PLT 454 (H) 02/15/2019   NA 139 02/15/2019   K 3.4 (L) 02/15/2019   CL 108 02/15/2019   CO2 22 02/15/2019   GLUCOSE 98 02/15/2019   BUN 12 02/15/2019   CREATININE 0.83 02/15/2019   BILITOT 0.4 04/13/2015   ALKPHOS 91  04/13/2015   AST 9 04/13/2015   ALT 11 04/13/2015   PROT 7.2 04/13/2015   ALBUMIN 4.3 04/13/2015   CALCIUM 8.7 (L) 02/15/2019   GFRAA >60 02/15/2019    Speciality Comments: No specialty comments available.  Procedures:  No procedures performed Allergies: Patient has no known allergies.   Assessment / Plan:     Visit Diagnoses: Sjogren's syndrome with keratoconjunctivitis sicca ([place]) - 09/03/20: ANA+, dsDNA<5, ESR 22, uric acid 4.5, RNP-, smith-, Ro-, La 1.6, RF<7, CCP<20, TSH 1.262 -she has positive ANA, positive La antibody and has sicca symptoms.  She also complains of dry skin.  Detailed counseling guarding Sjogren's was provided.  Over-the-counter products were discussed.  We also discussed possible use of hydroxychloroquine in the future.  Plan: COMPLETE METABOLIC PANEL WITH GFR, CBC with Differential/Platelet, Urinalysis, Routine w reflex microscopic, ANA, Sjogrens syndrome-B extractable nuclear antibody, Anti-DNA antibody, double-stranded, Anti-scleroderma antibody, C3 and C4, Beta-2 glycoprotein antibodies, Cardiolipin antibodies, IgG, IgM, IgA, Lupus Anticoagulant Eval w/Reflex  SS-B antibody positive  Other fatigue -she has been experiencing increased fatigue lately.  She states she does exercise on a regular basis.  Plan: CK, Serum protein electrophoresis with reflex, Glucose 6 phosphate dehydrogenase  Pain, neck -she complains of neck stiffness and pain and also trapezius spasm.  Plan: XR Cervical Spine 2 or 3 views.  The x-ray of the cervical spine was unremarkable.  I also noted that patient had MRI of her cervical spine in the past which was unremarkable.  Chronic pain of both knees -she complains of pain and discomfort in her bilateral knee joints.  No warmth swelling or effusion was noted.  Plan: XR KNEE 3 VIEW RIGHT, XR KNEE 3 VIEW LEFT x-rays was consistent with mild osteoarthritis and chondromalacia patella.  Pain in both feet -she has bilateral pes cavus.  She also has  some dorsal spurring.  She complains of discomfort in her bilateral feet but no synovitis was noted.  Plan: XR Foot 2 Views Right, XR Foot 2 Views Left.  X-rays were consistent with osteoarthritis.  Vitamin D deficiency-she is on a supplement.  Other medical problems listed as follows:-  Vitamin B12 deficiency  Hypertriglyceridemia  Hepatic steatosis  Migraine with aura and with status migrainosus, not intractable  Thrombocytosis ([place])  Orders: Orders Placed This Encounter  Procedures  . XR Cervical Spine 2 or 3 views  . XR KNEE 3 VIEW RIGHT  . XR KNEE 3 VIEW LEFT  . XR Foot 2 Views Right  .  XR Foot 2 Views Left  . COMPLETE METABOLIC PANEL WITH GFR  . CBC with Differential/Platelet  . Urinalysis, Routine w reflex microscopic  . CK  . ANA  . Sjogrens syndrome-B extractable nuclear antibody  . Anti-DNA antibody, double-stranded  . Anti-scleroderma antibody  . C3 and C4  . Beta-2 glycoprotein antibodies  . Cardiolipin antibodies, IgG, IgM, IgA  . Lupus Anticoagulant Eval w/Reflex  . Serum protein electrophoresis with reflex  . Glucose 6 phosphate dehydrogenase   No orders of the defined types were placed in this encounter.   Face-to-face time spent with patient was 50 minutes. Greater than 50% of time was spent in counseling and coordination of care.  Follow-Up Instructions: Return for Pain in multiple joints, positive ANA.   Bo Merino, MD  Note - This record has been created using Editor, commissioning.  Chart creation errors have been sought, but may not always  have been located. Such creation errors do not reflect on  the standard of medical care.

## 2019-12-15 ENCOUNTER — Other Ambulatory Visit: Payer: Self-pay

## 2019-12-15 ENCOUNTER — Ambulatory Visit: Payer: Self-pay

## 2019-12-15 ENCOUNTER — Ambulatory Visit: Payer: Medicaid Other | Admitting: Rheumatology

## 2019-12-15 ENCOUNTER — Telehealth: Payer: Self-pay | Admitting: *Deleted

## 2019-12-15 ENCOUNTER — Encounter: Payer: Self-pay | Admitting: Rheumatology

## 2019-12-15 VITALS — BP 140/97 | HR 84 | Resp 16 | Ht 66.0 in | Wt 225.0 lb

## 2019-12-15 DIAGNOSIS — G43101 Migraine with aura, not intractable, with status migrainosus: Secondary | ICD-10-CM

## 2019-12-15 DIAGNOSIS — R5383 Other fatigue: Secondary | ICD-10-CM

## 2019-12-15 DIAGNOSIS — M79672 Pain in left foot: Secondary | ICD-10-CM | POA: Diagnosis not present

## 2019-12-15 DIAGNOSIS — E559 Vitamin D deficiency, unspecified: Secondary | ICD-10-CM

## 2019-12-15 DIAGNOSIS — M25562 Pain in left knee: Secondary | ICD-10-CM

## 2019-12-15 DIAGNOSIS — M79671 Pain in right foot: Secondary | ICD-10-CM

## 2019-12-15 DIAGNOSIS — G8929 Other chronic pain: Secondary | ICD-10-CM

## 2019-12-15 DIAGNOSIS — M542 Cervicalgia: Secondary | ICD-10-CM | POA: Diagnosis not present

## 2019-12-15 DIAGNOSIS — D473 Essential (hemorrhagic) thrombocythemia: Secondary | ICD-10-CM

## 2019-12-15 DIAGNOSIS — E538 Deficiency of other specified B group vitamins: Secondary | ICD-10-CM

## 2019-12-15 DIAGNOSIS — D75839 Thrombocytosis, unspecified: Secondary | ICD-10-CM

## 2019-12-15 DIAGNOSIS — M25561 Pain in right knee: Secondary | ICD-10-CM

## 2019-12-15 DIAGNOSIS — R768 Other specified abnormal immunological findings in serum: Secondary | ICD-10-CM

## 2019-12-15 DIAGNOSIS — K760 Fatty (change of) liver, not elsewhere classified: Secondary | ICD-10-CM

## 2019-12-15 DIAGNOSIS — M3501 Sicca syndrome with keratoconjunctivitis: Secondary | ICD-10-CM

## 2019-12-15 DIAGNOSIS — E781 Pure hyperglyceridemia: Secondary | ICD-10-CM

## 2019-12-15 MED ORDER — BOTOX 100 UNITS IJ SOLR: INTRAMUSCULAR | 1 refills | Status: DC

## 2019-12-15 NOTE — Telephone Encounter (Signed)
Patient has an upcoming Botox appointment on 12/23/2019.  Will you please send a prescription to [facility]?

## 2019-12-15 NOTE — Patient Instructions (Addendum)
Treating Dry Mouth  Dry Mouth (also called xerostomia) is a condition that occurs when your body doesn't produce enough saliva. Saliva is an essential body fluid for protection and preservation of the oral cavity and oral functions.  Dry mouth can cause complications such as difficulty swallowing, severe and progressive tooth decay, and oral infections (particularly fungal).   Below are products and other management strategies that can help relieve the symptoms and reduce complications of dry mouth.  . Keep your mouth moist by sipping small amounts of water during the day. Excessive sips of can reduce the oral mucus film and increase symptoms. . Avoid frequent intake of acidic and caffeinated beverages. . Increase salivary secretion by chewing gum containing no sugar or sucking sugar free hard candies. . Over-the-counter saliva substitutes such as Biotne( Moisturizing Gel, Oral Rinses and Mouth Spray), Oramoist (patches), OraCoat (melts and lozenges).  Biotne also makes non-irritating toothpaste.  Journal for Nurse Practitioners, 15(4), [phone]. Retrieved June 24, 2018 from http://clinicalkey.com/nursing">  Knee Exercises Ask your health care provider which exercises are safe for you. Do exercises exactly as told by your health care provider and adjust them as directed. It is normal to feel mild stretching, pulling, tightness, or discomfort as you do these exercises. Stop right away if you feel sudden pain or your pain gets worse. Do not begin these exercises until told by your health care provider. Stretching and range-of-motion exercises These exercises warm up your muscles and joints and improve the movement and flexibility of your knee. These exercises also help to relieve pain and swelling. Knee extension, prone 1. Lie on your abdomen (prone position) on a bed. 2. Place your left / right knee just beyond the edge of the surface so your knee is not on the bed. You can put a towel under your  left / right thigh just above your kneecap for comfort. 3. Relax your leg muscles and allow gravity to straighten your knee (extension). You should feel a stretch behind your left / right knee. 4. Hold this position for __________ seconds. 5. Scoot up so your knee is supported between repetitions. Repeat __________ times. Complete this exercise __________ times a day. Knee flexion, active  1. Lie on your back with both legs straight. If this causes back discomfort, bend your left / right knee so your foot is flat on the floor. 2. Slowly slide your left / right heel back toward your buttocks. Stop when you feel a gentle stretch in the front of your knee or thigh (flexion). 3. Hold this position for __________ seconds. 4. Slowly slide your left / right heel back to the starting position. Repeat __________ times. Complete this exercise __________ times a day. Quadriceps stretch, prone  1. Lie on your abdomen on a firm surface, such as a bed or padded floor. 2. Bend your left / right knee and hold your ankle. If you cannot reach your ankle or pant leg, loop a belt around your foot and grab the belt instead. 3. Gently pull your heel toward your buttocks. Your knee should not slide out to the side. You should feel a stretch in the front of your thigh and knee (quadriceps). 4. Hold this position for __________ seconds. Repeat __________ times. Complete this exercise __________ times a day. Hamstring, supine 1. Lie on your back (supine position). 2. Loop a belt or towel over the ball of your left / right foot. The ball of your foot is on the walking surface, right under your  toes. 3. Straighten your left / right knee and slowly pull on the belt to raise your leg until you feel a gentle stretch behind your knee (hamstring). ? Do not let your knee bend while you do this. ? Keep your other leg flat on the floor. 4. Hold this position for __________ seconds. Repeat __________ times. Complete this  exercise __________ times a day. Strengthening exercises These exercises build strength and endurance in your knee. Endurance is the ability to use your muscles for a long time, even after they get tired. Quadriceps, isometric This exercise stretches the muscles in front of your thigh (quadriceps) without moving your knee joint (isometric). 1. Lie on your back with your left / right leg extended and your other knee bent. Put a rolled towel or small pillow under your knee if told by your health care provider. 2. Slowly tense the muscles in the front of your left / right thigh. You should see your kneecap slide up toward your hip or see increased dimpling just above the knee. This motion will push the back of the knee toward the floor. 3. For __________ seconds, hold the muscle as tight as you can without increasing your pain. 4. Relax the muscles slowly and completely. Repeat __________ times. Complete this exercise __________ times a day. Straight leg raises This exercise stretches the muscles in front of your thigh (quadriceps) and the muscles that move your hips (hip flexors). 1. Lie on your back with your left / right leg extended and your other knee bent. 2. Tense the muscles in the front of your left / right thigh. You should see your kneecap slide up or see increased dimpling just above the knee. Your thigh may even shake a bit. 3. Keep these muscles tight as you raise your leg 4-6 inches (10-15 cm) off the floor. Do not let your knee bend. 4. Hold this position for __________ seconds. 5. Keep these muscles tense as you lower your leg. 6. Relax your muscles slowly and completely after each repetition. Repeat __________ times. Complete this exercise __________ times a day. Hamstring, isometric 1. Lie on your back on a firm surface. 2. Bend your left / right knee about __________ degrees. 3. Dig your left / right heel into the surface as if you are trying to pull it toward your buttocks.  Tighten the muscles in the back of your thighs (hamstring) to "dig" as hard as you can without increasing any pain. 4. Hold this position for __________ seconds. 5. Release the tension gradually and allow your muscles to relax completely for __________ seconds after each repetition. Repeat __________ times. Complete this exercise __________ times a day. Hamstring curls If told by your health care provider, do this exercise while wearing ankle weights. Begin with __________ lb weights. Then increase the weight by 1 lb (0.5 kg) increments. Do not wear ankle weights that are more than __________ lb. 1. Lie on your abdomen with your legs straight. 2. Bend your left / right knee as far as you can without feeling pain. Keep your hips flat against the floor. 3. Hold this position for __________ seconds. 4. Slowly lower your leg to the starting position. Repeat __________ times. Complete this exercise __________ times a day. Squats This exercise strengthens the muscles in front of your thigh and knee (quadriceps). 1. Stand in front of a table, with your feet and knees pointing straight ahead. You may rest your hands on the table for balance but not for support.  2. Slowly bend your knees and lower your hips like you are going to sit in a chair. ? Keep your weight over your heels, not over your toes. ? Keep your lower legs upright so they are parallel with the table legs. ? Do not let your hips go lower than your knees. ? Do not bend lower than told by your health care provider. ? If your knee pain increases, do not bend as low. 3. Hold the squat position for __________ seconds. 4. Slowly push with your legs to return to standing. Do not use your hands to pull yourself to standing. Repeat __________ times. Complete this exercise __________ times a day. Wall slides This exercise strengthens the muscles in front of your thigh and knee (quadriceps). 1. Lean your back against a smooth wall or door, and  walk your feet out 18-24 inches (46-61 cm) from it. 2. Place your feet hip-width apart. 3. Slowly slide down the wall or door until your knees bend __________ degrees. Keep your knees over your heels, not over your toes. Keep your knees in line with your hips. 4. Hold this position for __________ seconds. Repeat __________ times. Complete this exercise __________ times a day. Straight leg raises This exercise strengthens the muscles that rotate the leg at the hip and move it away from your body (hip abductors). 1. Lie on your side with your left / right leg in the top position. Lie so your head, shoulder, knee, and hip line up. You may bend your bottom knee to help you keep your balance. 2. Roll your hips slightly forward so your hips are stacked directly over each other and your left / right knee is facing forward. 3. Leading with your heel, lift your top leg 4-6 inches (10-15 cm). You should feel the muscles in your outer hip lifting. ? Do not let your foot drift forward. ? Do not let your knee roll toward the ceiling. 4. Hold this position for __________ seconds. 5. Slowly return your leg to the starting position. 6. Let your muscles relax completely after each repetition. Repeat __________ times. Complete this exercise __________ times a day. Straight leg raises This exercise stretches the muscles that move your hips away from the front of the pelvis (hip extensors). 1. Lie on your abdomen on a firm surface. You can put a pillow under your hips if that is more comfortable. 2. Tense the muscles in your buttocks and lift your left / right leg about 4-6 inches (10-15 cm). Keep your knee straight as you lift your leg. 3. Hold this position for __________ seconds. 4. Slowly lower your leg to the starting position. 5. Let your leg relax completely after each repetition. Repeat __________ times. Complete this exercise __________ times a day. This information is not intended to replace advice  given to you by your health care provider. Make sure you discuss any questions you have with your health care provider. Document Revised: 06/25/2018 Document Reviewed: 06/25/2018 Elsevier Patient Education  2020 [employer].

## 2019-12-15 NOTE — Telephone Encounter (Signed)
Botox rx sent to [facility].

## 2019-12-16 NOTE — Telephone Encounter (Signed)
PA request for Botox faxed to [facility]

## 2019-12-17 NOTE — Telephone Encounter (Signed)
I called [facility] [phone] and spoke to [place] to check status of PA.  He states that there are actually 2 PA's with the same dates of service.  PA# [id number] and PA# [id number] both Valid from 12/16/2019-03/15/2020.  Ref# for call is [id number].   I then called [facility] to schedule delivery of Botox.  I spoke to [place] who states it is still in "Process" and delivery cant be scheduled at this time.  I will call back tomorrow.

## 2019-12-18 LAB — PROTEIN ELECTROPHORESIS, SERUM, WITH REFLEX
Albumin ELP: 4.1 g/dL (ref 3.8–4.8)
Alpha 1: 0.3 g/dL (ref 0.2–0.3)
Alpha 2: 0.8 g/dL (ref 0.5–0.9)
Beta 2: 0.4 g/dL (ref 0.2–0.5)
Beta Globulin: 0.4 g/dL (ref 0.4–0.6)
Gamma Globulin: 1 g/dL (ref 0.8–1.7)
Total Protein: 7 g/dL (ref 6.1–8.1)

## 2019-12-18 LAB — CBC WITH DIFFERENTIAL/PLATELET
Absolute Monocytes: 466 cells/uL (ref 200–950)
Basophils Absolute: 67 cells/uL (ref 0–200)
Basophils Relative: 0.9 %
Eosinophils Absolute: 141 cells/uL (ref 15–500)
Eosinophils Relative: 1.9 %
HCT: 43.4 % (ref 35.0–45.0)
Hemoglobin: 14.4 g/dL (ref 11.7–15.5)
Lymphs Abs: 2102 cells/uL (ref 850–3900)
MCH: 30.2 pg (ref 27.0–33.0)
MCHC: 33.2 g/dL (ref 32.0–36.0)
MCV: 91 fL (ref 80.0–100.0)
MPV: 9.1 fL (ref 7.5–12.5)
Monocytes Relative: 6.3 %
Neutro Abs: 4625 cells/uL (ref 1500–7800)
Neutrophils Relative %: 62.5 %
Platelets: 458 10*3/uL — ABNORMAL HIGH (ref 140–400)
RBC: 4.77 10*6/uL (ref 3.80–5.10)
RDW: 12.3 % (ref 11.0–15.0)
Total Lymphocyte: 28.4 %
WBC: 7.4 10*3/uL (ref 3.8–10.8)

## 2019-12-18 LAB — SJOGRENS SYNDROME-B EXTRACTABLE NUCLEAR ANTIBODY: SSB (La) (ENA) Antibody, IgG: 1.6 AI — AB

## 2019-12-18 LAB — COMPLETE METABOLIC PANEL WITH GFR
AG Ratio: 1.3 (calc) (ref 1.0–2.5)
ALT: 16 U/L (ref 6–29)
AST: 16 U/L (ref 10–30)
Albumin: 4 g/dL (ref 3.6–5.1)
Alkaline phosphatase (APISO): 85 U/L (ref 31–125)
BUN: 12 mg/dL (ref 7–25)
CO2: 26 mmol/L (ref 20–32)
Calcium: 9.2 mg/dL (ref 8.6–10.2)
Chloride: 104 mmol/L (ref 98–110)
Creat: 0.64 mg/dL (ref 0.50–1.10)
GFR, Est African American: 127 mL/min/{1.73_m2} (ref >=60)
GFR, Est Non African American: 109 mL/min/{1.73_m2} (ref >=60)
Globulin: 3 g/dL (calc) (ref 1.9–3.7)
Glucose, Bld: 79 mg/dL (ref 65–99)
Potassium: 4.4 mmol/L (ref 3.5–5.3)
Sodium: 138 mmol/L (ref 135–146)
Total Bilirubin: 0.7 mg/dL (ref 0.2–1.2)
Total Protein: 7 g/dL (ref 6.1–8.1)

## 2019-12-18 LAB — LUPUS ANTICOAGULANT EVAL W/ REFLEX
PTT-LA Screen: 38 s (ref <=40)
dRVVT: 42 s (ref <=45)

## 2019-12-18 LAB — CARDIOLIPIN ANTIBODIES, IGG, IGM, IGA
Anticardiolipin IgA: <11 [APL'U]
Anticardiolipin IgG: <14 [GPL'U]
Anticardiolipin IgM: <12 [MPL'U]

## 2019-12-18 LAB — URINALYSIS, ROUTINE W REFLEX MICROSCOPIC
Bilirubin Urine: NEGATIVE
Glucose, UA: NEGATIVE
Hgb urine dipstick: NEGATIVE
Ketones, ur: NEGATIVE
Leukocytes,Ua: NEGATIVE
Nitrite: NEGATIVE
Protein, ur: NEGATIVE
Specific Gravity, Urine: 1.018 (ref 1.001–1.03)
pH: 6 (ref 5.0–8.0)

## 2019-12-18 LAB — ANTI-SCLERODERMA ANTIBODY: Scleroderma (Scl-70) (ENA) Antibody, IgG: <1 AI

## 2019-12-18 LAB — CK: Total CK: 152 U/L — ABNORMAL HIGH (ref 29–143)

## 2019-12-18 LAB — BETA-2 GLYCOPROTEIN ANTIBODIES
Beta-2 Glyco 1 IgA: <9 SAU (ref <=20)
Beta-2 Glyco 1 IgM: <9 SMU (ref <=20)
Beta-2 Glyco I IgG: <9 SGU (ref <=20)

## 2019-12-18 LAB — ANTI-DNA ANTIBODY, DOUBLE-STRANDED: ds DNA Ab: <1 IU/mL

## 2019-12-18 LAB — ANA: Anti Nuclear Antibody (ANA): NEGATIVE

## 2019-12-18 LAB — C3 AND C4
C3 Complement: 167 mg/dL (ref 83–193)
C4 Complement: 30 mg/dL (ref 15–57)

## 2019-12-18 LAB — GLUCOSE 6 PHOSPHATE DEHYDROGENASE: G-6PDH: 13 U/g Hgb (ref 7.0–20.5)

## 2019-12-18 NOTE — Telephone Encounter (Signed)
I called [facility] [phone] to check the status of patients Botox delivery.  I spoke to [place] who states it is still in process.  She states they need the BIN number or [facility] [employer] and ID.  States the patient will need to call Medicaid to get this information and call them with it.  I called patient and told her to please call Medicaid and then call [facility] with the information needed to fill her RX.  I asked her to please call me once she has taken care of this so I can call to schedule delivery.  Patient aware that if we cant get this taken care of today we may have to reschedule.

## 2019-12-22 NOTE — Telephone Encounter (Signed)
Patient called [facility] with the requested BIN number for Medicaid [id number] which is [phone] and she was told they would not be able to fill Medicaid prescriptions.  I will try to find a SP and get back to patient.

## 2019-12-23 ENCOUNTER — Ambulatory Visit: Payer: Medicaid Other | Admitting: Neurology

## 2019-12-24 ENCOUNTER — Other Ambulatory Visit: Payer: Self-pay

## 2019-12-24 ENCOUNTER — Ambulatory Visit: Payer: Medicaid Other | Admitting: Rheumatology

## 2019-12-24 ENCOUNTER — Encounter: Payer: Self-pay | Admitting: Rheumatology

## 2019-12-24 VITALS — BP 140/104 | HR 88 | Ht 66.0 in | Wt 224.0 lb

## 2019-12-24 DIAGNOSIS — M170 Bilateral primary osteoarthritis of knee: Secondary | ICD-10-CM | POA: Diagnosis not present

## 2019-12-24 DIAGNOSIS — R5383 Other fatigue: Secondary | ICD-10-CM

## 2019-12-24 DIAGNOSIS — M19071 Primary osteoarthritis, right ankle and foot: Secondary | ICD-10-CM

## 2019-12-24 DIAGNOSIS — G43101 Migraine with aura, not intractable, with status migrainosus: Secondary | ICD-10-CM

## 2019-12-24 DIAGNOSIS — E538 Deficiency of other specified B group vitamins: Secondary | ICD-10-CM

## 2019-12-24 DIAGNOSIS — M3501 Sicca syndrome with keratoconjunctivitis: Secondary | ICD-10-CM

## 2019-12-24 DIAGNOSIS — M79641 Pain in right hand: Secondary | ICD-10-CM

## 2019-12-24 DIAGNOSIS — E559 Vitamin D deficiency, unspecified: Secondary | ICD-10-CM

## 2019-12-24 DIAGNOSIS — M542 Cervicalgia: Secondary | ICD-10-CM

## 2019-12-24 DIAGNOSIS — M79642 Pain in left hand: Secondary | ICD-10-CM

## 2019-12-24 DIAGNOSIS — D473 Essential (hemorrhagic) thrombocythemia: Secondary | ICD-10-CM

## 2019-12-24 DIAGNOSIS — K760 Fatty (change of) liver, not elsewhere classified: Secondary | ICD-10-CM

## 2019-12-24 DIAGNOSIS — D75839 Thrombocytosis, unspecified: Secondary | ICD-10-CM

## 2019-12-24 DIAGNOSIS — E781 Pure hyperglyceridemia: Secondary | ICD-10-CM

## 2019-12-24 DIAGNOSIS — M19072 Primary osteoarthritis, left ankle and foot: Secondary | ICD-10-CM

## 2019-12-24 MED ORDER — PILOCARPINE HCL 5 MG PO TABS: 5.0000 mg | ORAL_TABLET | Freq: Three times a day (TID) | ORAL | 2 refills | Status: DC | PRN

## 2019-12-24 NOTE — Progress Notes (Signed)
Office Visit Note  Patient: Kelly Marsh             Date of Birth: [birth year]           MRN: [id number]             PCP: Simona Huh, NP Referring: Simona Huh, NP Visit Date: 12/24/2019 Occupation: @GUAROCC @  Subjective:  Sicca symptoms   History of Present Illness: Kelly Marsh is a 44 y.o. female with history of Sjogren's syndrome and osteoarthritis.  She continues to have chronic sicca symptoms.  She tries to drink lots of fluids to help with mouth dryness.  She uses OTC eyedrops for eye dryness.  She continues to have discomfort due to a dorsal spur on the left foot.  She denies any joint swelling at this time.  She denies any recent rashes or photosensitivity.  She denies any oral or nasal ulcerations.   Activities of Daily Living:  Patient reports morning stiffness for several hours.   Patient Reports nocturnal pain.  Difficulty dressing/grooming: Denies Difficulty climbing stairs: Denies Difficulty getting out of chair: Denies Difficulty using hands for taps, buttons, cutlery, and/or writing: Denies  Review of Systems  Constitutional: Negative for fatigue.  HENT: Positive for mouth dryness. Negative for mouth sores and nose dryness.   Eyes: Positive for dryness. Negative for pain, itching and visual disturbance.  Respiratory: Negative for cough, hemoptysis, shortness of breath and difficulty breathing.   Cardiovascular: Negative for chest pain, palpitations, hypertension and swelling in legs/feet.  Gastrointestinal: Negative for blood in stool, constipation and diarrhea.  Endocrine: Negative for increased urination.  Genitourinary: Negative for difficulty urinating and painful urination.  Musculoskeletal: Positive for arthralgias, joint pain, joint swelling, myalgias, morning stiffness, muscle tenderness and myalgias. Negative for muscle weakness.  Skin: Negative for color change, pallor, rash, hair loss, nodules/bumps, redness, skin tightness, ulcers and sensitivity to  sunlight.  Allergic/Immunologic: Negative for susceptible to infections.  Neurological: Positive for headaches. Negative for dizziness, numbness and weakness.  Hematological: Negative for swollen glands.  Psychiatric/Behavioral: Negative for depressed mood, confusion and sleep disturbance. The patient is not nervous/anxious.     PMFS History:  Patient Active Problem List   Diagnosis Date Noted  . Chronic migraine without aura, with intractable migraine, so stated, with status migrainosus 12/06/2017  . Chronic migraine without aura without status migrainosus, not intractable 10/26/2015  . Migraine with aura and with status migrainosus, not intractable 04/13/2015  . Blurry vision, bilateral 04/13/2015  . Perceived hearing changes 04/13/2015  . Daily headache 04/13/2015    Past Medical History:  Diagnosis Date  . Fatigue   . Migraine   . Snoring   . Vitamin B12 deficiency   . Vitamin D deficiency     Family History  Problem Relation Age of Onset  . Diabetes Mother   . Cancer Maternal Grandmother   . Cancer Maternal Grandfather   . Diabetes Sister   . Diabetes Brother   . Cancer Brother   . Healthy Daughter   . Healthy Son   . Healthy Son   . Healthy Son   . OCD Son   . Healthy Son   . Migraines Neg Hx    Past Surgical History:  Procedure Laterality Date  . NO PAST SURGERIES     Social History   Social History Narrative   Lives at home with husband and family   Caffeine use: 1 cup coffee per week    There is no immunization history on file  for this patient.   Objective: Vital Signs: BP (!) 140/104 (BP Location: Left Arm, Patient Position: Sitting, Cuff Size: Normal)   Pulse 88   Ht 5\' 6"  (1.676 m)   Wt 224 lb (101.6 kg)   BMI 36.15 kg/m    Physical Exam Vitals and nursing note reviewed.  Constitutional:      Appearance: She is well-developed.  HENT:     Head: Normocephalic and atraumatic.  Eyes:     Conjunctiva/sclera: Conjunctivae normal.  Pulmonary:      Effort: Pulmonary effort is normal.  Abdominal:     General: Bowel sounds are normal.     Palpations: Abdomen is soft.  Musculoskeletal:     Cervical back: Normal range of motion.  Lymphadenopathy:     Cervical: No cervical adenopathy.  Skin:    General: Skin is warm and dry.     Capillary Refill: Capillary refill takes less than 2 seconds.  Neurological:     Mental Status: She is alert and oriented to person, place, and time.  Psychiatric:        Behavior: Behavior normal.      Musculoskeletal Exam: C-spine, thoracic spine, and lumbar spine good ROM.  No midline spinal tenderness.  Shoulder joints, elbow joints, wrist joints, MCPs, PIPs, and DIPs good ROM with no synovitis.  PIP and DIP thickening consistent with osteoarthritis of both hands. Complete fist formation bilaterally.  Hip joints, knee joints, ankle joints, MTPs, PIPs, and DIPs good ROM with no synovitis.  No warmth or effusion of knee joints.  No tenderness or swelling of ankle joints. Tenderness of dorsal spur on left foot.   CDAI Exam: CDAI Score: -- Patient Global: --; Provider Global: -- Swollen: --; Tender: -- Joint Exam 12/24/2019   No joint exam has been documented for this visit   There is currently no information documented on the homunculus. Go to the Rheumatology activity and complete the homunculus joint exam.  Investigation: No additional findings.  Imaging: XR Cervical Spine 2 or 3 views  Result Date: 12/15/2019 No disc space narrowing was noted.  No anterior posterior spurring was noted.  No facet joint arthropathy was noted. Impression: Unremarkable x-ray of the cervical spine.  XR Foot 2 Views Left  Result Date: 12/15/2019 PIP and DIP narrowing was noted.  First MTP narrowing was noted.  Dorsal spurring was noted.  No intertarsal tibiotalar joint space narrowing was noted.  Inferior calcaneal spur was noted. Impression: These findings are consistent with osteoarthritis of the foot.  XR Foot  2 Views Right  Result Date: 12/15/2019 PIP and DIP narrowing was noted.  No MTP, intertarsal or tibiotalar joint space narrowing was noted.  A small calcaneal spur was noted. 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  XR KNEE 3 VIEW LEFT  Result Date: 12/15/2019 Mild patellofemoral narrowing was noted.  Mild medial compartment narrowing was noted.  No chondrocalcinosis was noted. Impression: These findings are consistent mild osteoarthritis and mild chondromalacia patella.  XR KNEE 3 VIEW RIGHT  Result Date: 12/15/2019 Moderate patellofemoral narrowing was noted.  Mild lateral compartment narrowing with lateral osteophytes and intercondylar osteophytes was noted.  No chondrocalcinosis was noted. Impression: These findings are consistent with mild osteoarthritis and moderate chondromalacia patella.   Recent Labs: Lab Results  Component Value Date   WBC 7.4 12/15/2019   HGB 14.4 12/15/2019   PLT 458 (H) 12/15/2019   NA 138 12/15/2019   K 4.4 12/15/2019   CL 104  12/15/2019   CO2 26 12/15/2019   GLUCOSE 79 12/15/2019   BUN 12 12/15/2019   CREATININE 0.64 12/15/2019   BILITOT 0.7 12/15/2019   ALKPHOS 91 04/13/2015   AST 16 12/15/2019   ALT 16 12/15/2019   PROT 7.0 12/15/2019   PROT 7.0 12/15/2019   ALBUMIN 4.3 04/13/2015   CALCIUM 9.2 12/15/2019   GFRAA 127 12/15/2019    Speciality Comments: No specialty comments available.  Procedures:  No procedures performed Allergies: Patient has no known allergies.   Assessment / Plan:     Visit Diagnoses: Sjogren's syndrome with keratoconjunctivitis sicca (HCC) - ANA+, [place]+: She continues to have chronic sicca symptoms.  She tries to stay well hydrated and uses OTC eyedrops for symptomatic relief.  She has no parotid gland tenderness or inflammation noted.  We discussed OTC products for symptomatic relief.  We discussed the diagnosis of Sjogren's syndrome and different treatment options.  We also  reviewed her lab work from 12/15/2019.  All questions were addressed.  We also discussed the indications, contraindications, and potential side effects of pilocarpine.  She will try taking pilocarpine 5 mg TID PRN for symptomatic relief.  She was advised to notify us if she cannot tolerate taking pilocarpine.  She was encouraged to have yearly eye examinations while on pilocarpine due to the risk of increased intraocular pressure.  We also discussed that if her symptoms persist or worsen Plaquenil as a potential treatment option in the future.  She was given a handout of information about Plaquenil to review.  She was advised to notify us if she would like to start on Plaquenil and she will return to the office for consent.  She will follow-up in the office in 6 months.  Other fatigue: Chronic but stable  Pain in both hands: She has PIP and DIP thickening consistent with osteoarthritis of both hands.  No tenderness or synovitis was noted.  She has complete fist formation bilaterally.  Joint protection and muscle strengthening were discussed.  She was given a handout of natural anti-inflammatories to start taking.  This handout was reviewed with the patient today in the office.  All questions were addressed.  Primary osteoarthritis of both knees: She has good range of motion in both knee joints on exam.  No warmth or effusion was noted.  Primary osteoarthritis of both feet: She experiences discomfort due to the dorsal spur on her left foot.  We discussed the importance of wearing proper fitting shoes.  Neck pain - X-rays were unremarkable.  She has good range of motion with no discomfort.  Thrombocytosis ([place]): Platelets were 458 on 12/15/2019.  We will recheck platelet count every 6 months.  Other medical conditions are listed as follows:  Vitamin D deficiency  Vitamin B12 deficiency  Hypertriglyceridemia  Hepatic steatosis  Migraine with aura and with status migrainosus, not  intractable  Orders: No orders of the defined types were placed in this encounter.  Meds ordered this encounter  Medications  . pilocarpine (SALAGEN) 5 MG tablet    Sig: Take 1 tablet (5 mg total) by mouth 3 (three) times daily as needed.    Dispense:  90 tablet    Refill:  2    Face-to-face time spent with patient was 30 minutes. Greater than 50% of time was spent in counseling and coordination of care.  Follow-Up Instructions: Return in about 6 months (around 06/24/2020) for Sjogren's syndrome.   Ofilia Neas, PA-C   I examined and evaluated the patient  with Hazel Sams PA.  Patient continues to have sicca symptoms.  We had detailed discussion regarding different treatment options.  For the symptomatic treatment she was given a prescription for pilocarpine after side effects were discussed.  We also gave her a handout on Plaquenil to review.  If she continues to have symptoms and Plaquenil could be added later.  The plan of care was discussed as noted above.  Bo Merino, MD  Note - This record has been created using Editor, commissioning.  Chart creation errors have been sought, but may not always  have been located. Such creation errors do not reflect on  the standard of medical care.

## 2019-12-24 NOTE — Patient Instructions (Addendum)
Hydroxychloroquine tablets What is this medicine? HYDROXYCHLOROQUINE (hye drox ee KLOR oh kwin) is used to treat rheumatoid arthritis and systemic lupus erythematosus. It is also used to treat malaria. This medicine may be used for other purposes; ask your health care provider or pharmacist if you have questions. COMMON BRAND NAME(S): Plaquenil, Quineprox What should I tell my health care provider before I take this medicine? They need to know if you have any of these conditions:  diabetes  eye disease, vision problems  G6PD deficiency  heart disease  history of irregular heartbeat  if you often drink alcohol  kidney disease  liver disease  porphyria  psoriasis  an unusual or allergic reaction to chloroquine, hydroxychloroquine, other medicines, foods, dyes, or preservatives  pregnant or trying to get pregnant  breast-feeding How should I use this medicine? Take this medicine by mouth with a glass of water. Follow the directions on the prescription label. Do not cut, crush or chew this medicine. Swallow the tablets whole. Take this medicine with food. Avoid taking antacids within 4 hours of taking this medicine. It is best to separate these medicines by at least 4 hours. Take your medicine at regular intervals. Do not take it more often than directed. Take all of your medicine as directed even if you think you are better. Do not skip doses or stop your medicine early. Talk to your pediatrician regarding the use of this medicine in children. While this drug may be prescribed for selected conditions, precautions do apply. Overdosage: If you think you have taken too much of this medicine contact a poison control center or emergency room at once. NOTE: This medicine is only for you. Do not share this medicine with others. What if I miss a dose? If you miss a dose, take it as soon as you can. If it is almost time for your next dose, take only that dose. Do not take double or extra  doses. What may interact with this medicine? Do not take this medicine with any of the following medications:  cisapride  dronedarone  pimozide  thioridazine This medicine may also interact with the following medications:  ampicillin  antacids  cimetidine  cyclosporine  digoxin  kaolin  medicines for diabetes, like insulin, glipizide, glyburide  medicines for seizures like carbamazepine, phenobarbital, phenytoin  mefloquine  methotrexate  other medicines that prolong the QT interval (cause an abnormal heart rhythm)  praziquantel This list may not describe all possible interactions. Give your health care provider a list of all the medicines, herbs, non-prescription drugs, or dietary supplements you use. Also tell them if you smoke, drink alcohol, or use illegal drugs. Some items may interact with your medicine. What should I watch for while using this medicine? Visit your health care professional for regular checks on your progress. Tell your health care professional if your symptoms do not start to get better or if they get worse. You may need blood work done while you are taking this medicine. If you take other medicines that can affect heart rhythm, you may need more testing. Talk to your health care professional if you have questions. Your vision may be tested before and during use of this medicine. Tell your health care professional right away if you have any change in your eyesight. What side effects may I notice from receiving this medicine? Side effects that you should report to your doctor or health care professional as soon as possible:  allergic reactions like skin rash, itching or hives,   swelling of the face, lips, or tongue  changes in vision  decreased hearing or ringing of the ears  muscle weakness  redness, blistering, peeling or loosening of the skin, including inside the mouth  sensitivity to light  signs and symptoms of a dangerous change in  heartbeat or heart rhythm like chest pain; dizziness; fast or irregular heartbeat; palpitations; feeling faint or lightheaded, falls; breathing problems  signs and symptoms of liver injury like dark yellow or brown urine; general ill feeling or flu-like symptoms; light-colored stools; loss of appetite; nausea; right upper belly pain; unusually weak or tired; yellowing of the eyes or skin  signs and symptoms of low blood sugar such as feeling anxious; confusion; dizziness; increased hunger; unusually weak or tired; sweating; shakiness; cold; irritable; headache; blurred vision; fast heartbeat; loss of consciousness  suicidal thoughts  uncontrollable head, mouth, neck, arm, or leg movements Side effects that usually do not require medical attention (report to your doctor or health care professional if they continue or are bothersome):  diarrhea  dizziness  hair loss  headache  irritable  loss of appetite  nausea, vomiting  stomach pain This list may not describe all possible side effects. Call your doctor for medical advice about side effects. You may report side effects to FDA at 1-800-FDA-1088. Where should I keep my medicine? Keep out of the reach of children. Store at room temperature between 15 and 30 degrees C (59 and 86 degrees F). Protect from moisture and light. Throw away any unused medicine after the expiration date. NOTE: This sheet is a summary. It may not cover all possible information. If you have questions about this medicine, talk to your doctor, pharmacist, or health care provider.  2020 Elsevier/Gold Standard (2019-01-13 12:56:32)    Hand Exercises Hand exercises can be helpful for almost anyone. These exercises can strengthen the hands, improve flexibility and movement, and increase blood flow to the hands. These results can make work and daily tasks easier. Hand exercises can be especially helpful for people who have joint pain from arthritis or have nerve  damage from overuse (carpal tunnel syndrome). These exercises can also help people who have injured a hand. Exercises Most of these hand exercises are gentle stretching and motion exercises. It is usually safe to do them often throughout the day. Warming up your hands before exercise may help to reduce stiffness. You can do this with gentle massage or by placing your hands in warm water for 10-15 minutes. It is normal to feel some stretching, pulling, tightness, or mild discomfort as you begin new exercises. This will gradually improve. Stop an exercise right away if you feel sudden, severe pain or your pain gets worse. Ask your health care provider which exercises are best for you. Knuckle bend or "claw" fist 1. Stand or sit with your arm, hand, and all five fingers pointed straight up. Make sure to keep your wrist straight during the exercise. 2. Gently bend your fingers down toward your palm until the tips of your fingers are touching the top of your palm. Keep your big knuckle straight and just bend the small knuckles in your fingers. 3. Hold this position for __________ seconds. 4. Straighten (extend) your fingers back to the starting position. Repeat this exercise 5-10 times with each hand. Full finger fist 1. Stand or sit with your arm, hand, and all five fingers pointed straight up. Make sure to keep your wrist straight during the exercise. 2. Gently bend your fingers into your  palm until the tips of your fingers are touching the middle of your palm. 3. Hold this position for __________ seconds. 4. Extend your fingers back to the starting position, stretching every joint fully. Repeat this exercise 5-10 times with each hand. Straight fist 1. Stand or sit with your arm, hand, and all five fingers pointed straight up. Make sure to keep your wrist straight during the exercise. 2. Gently bend your fingers at the big knuckle, where your fingers meet your hand, and the middle knuckle. Keep the  knuckle at the tips of your fingers straight and try to touch the bottom of your palm. 3. Hold this position for __________ seconds. 4. Extend your fingers back to the starting position, stretching every joint fully. Repeat this exercise 5-10 times with each hand. Tabletop 1. Stand or sit with your arm, hand, and all five fingers pointed straight up. Make sure to keep your wrist straight during the exercise. 2. Gently bend your fingers at the big knuckle, where your fingers meet your hand, as far down as you can while keeping the small knuckles in your fingers straight. Think of forming a tabletop with your fingers. 3. Hold this position for __________ seconds. 4. Extend your fingers back to the starting position, stretching every joint fully. Repeat this exercise 5-10 times with each hand. Finger spread 1. Place your hand flat on a table with your palm facing down. Make sure your wrist stays straight as you do this exercise. 2. Spread your fingers and thumb apart from each other as far as you can until you feel a gentle stretch. Hold this position for __________ seconds. 3. Bring your fingers and thumb tight together again. Hold this position for __________ seconds. Repeat this exercise 5-10 times with each hand. Making circles 1. Stand or sit with your arm, hand, and all five fingers pointed straight up. Make sure to keep your wrist straight during the exercise. 2. Make a circle by touching the tip of your thumb to the tip of your index finger. 3. Hold for __________ seconds. Then open your hand wide. 4. Repeat this motion with your thumb and each finger on your hand. Repeat this exercise 5-10 times with each hand. Thumb motion 1. Sit with your forearm resting on a table and your wrist straight. Your thumb should be facing up toward the ceiling. Keep your fingers relaxed as you move your thumb. 2. Lift your thumb up as high as you can toward the ceiling. Hold for __________ seconds. 3. Bend  your thumb across your palm as far as you can, reaching the tip of your thumb for the small finger (pinkie) side of your palm. Hold for __________ seconds. Repeat this exercise 5-10 times with each hand. Grip strengthening  1. Hold a stress ball or other soft ball in the middle of your hand. 2. Slowly increase the pressure, squeezing the ball as much as you can without causing pain. Think of bringing the tips of your fingers into the middle of your palm. All of your finger joints should bend when doing this exercise. 3. Hold your squeeze for __________ seconds, then relax. Repeat this exercise 5-10 times with each hand. Contact a health care provider if:  Your hand pain or discomfort gets much worse when you do an exercise.  Your hand pain or discomfort does not improve within 2 hours after you exercise. If you have any of these problems, stop doing these exercises right away. Do not do them again unless  your health care provider says that you can. Get help right away if:  You develop sudden, severe hand pain or swelling. If this happens, stop doing these exercises right away. Do not do them again unless your health care provider says that you can. This information is not intended to replace advice given to you by your health care provider. Make sure you discuss any questions you have with your health care provider. Document Revised: 12/26/2018 Document Reviewed: 09/05/2018 Elsevier Patient Education  [place].

## 2019-12-29 NOTE — Telephone Encounter (Signed)
Kelly Marsh with [facility] [phone] called to schedule medication delivery.  TBD 01/06/2020.

## 2020-01-15 ENCOUNTER — Telehealth: Payer: Self-pay | Admitting: Neurology

## 2020-01-15 NOTE — Telephone Encounter (Signed)
Kelly Marsh from [facility] called stating she has faxed the wrong letter to [place] but wanted to inform RN that Kelly Marsh is not contracted with [place] Medicaid. The designated pharmacy is [facility]

## 2020-01-19 NOTE — Telephone Encounter (Signed)
Noted.  Patient will be getting her Botox through [facility].

## 2020-01-27 ENCOUNTER — Ambulatory Visit: Payer: Medicaid Other | Admitting: Neurology

## 2020-01-27 ENCOUNTER — Other Ambulatory Visit: Payer: Self-pay

## 2020-01-27 VITALS — Temp 97.1°F

## 2020-01-27 DIAGNOSIS — M7918 Myalgia, other site: Secondary | ICD-10-CM

## 2020-01-27 DIAGNOSIS — G43711 Chronic migraine without aura, intractable, with status migrainosus: Secondary | ICD-10-CM | POA: Diagnosis not present

## 2020-01-27 NOTE — Progress Notes (Signed)
Botox- 100 units x 2 vials Lot: [id number] Expiration: 03/2022 NDC: [id number]  Bacteriostatic 0.9% Sodium Chloride- 39mL total Lot: [id number] Expiration: 03/18/2020 NDC: [id number]  Dx: [id number] S/P

## 2020-01-27 NOTE — Progress Notes (Signed)
Consent Form Botulism Toxin Injection For Chronic Migraine  Interval history 01/27/2020: has a tooth infection and getting more migraines since then but still she has done exceptionally well with this treatment. >50% reduction in frequency and severity of migraines and headaches since starting botox. Patient has tried multiple acute meds.+a.  [address]: Dry Needling  Reviewed orally with patient, additionally signature is on file:  Botulism toxin has been approved by the Federal drug administration for treatment of chronic migraine. Botulism toxin does not cure chronic migraine and it may not be effective in some patients.  The administration of botulism toxin is accomplished by injecting a small amount of toxin into the muscles of the neck and head. Dosage must be titrated for each individual. Any benefits resulting from botulism toxin tend to wear off after 3 months with a repeat injection required if benefit is to be maintained. Injections are usually done every 3-4 months with maximum effect peak achieved by about 2 or 3 weeks. Botulism toxin is expensive and you should be sure of what costs you will incur resulting from the injection.  The side effects of botulism toxin use for chronic migraine may include:   -Transient, and usually mild, facial weakness with facial injections  -Transient, and usually mild, head or neck weakness with head/neck injections  -Reduction or loss of forehead facial animation due to forehead muscle weakness  -Eyelid drooping  -Dry eye  -Pain at the site of injection or bruising at the site of injection  -Double vision  -Potential unknown long term risks  Contraindications: You should not have Botox if you are pregnant, nursing, allergic to albumin, have an infection, skin condition, or muscle weakness at the site of the injection, or have myasthenia gravis, Lambert-Eaton syndrome, or ALS.  It is also possible that as with any injection, there may be an  allergic reaction or no effect from the medication. Reduced effectiveness after repeated injections is sometimes seen and rarely infection at the injection site may occur. All care will be taken to prevent these side effects. If therapy is given over a long time, atrophy and wasting in the muscle injected may occur. Occasionally the patient's become refractory to treatment because they develop antibodies to the toxin. In this event, therapy needs to be modified.  I have read the above information and consent to the administration of botulism toxin.    BOTOX PROCEDURE NOTE FOR MIGRAINE HEADACHE    Contraindications and precautions discussed with patient(above). Aseptic procedure was observed and patient tolerated procedure. Procedure performed by Dr. Georgia Dom  The condition has existed for more than 6 months, and pt does not have a diagnosis of ALS, Myasthenia Gravis or Lambert-Eaton Syndrome.  Risks and benefits of injections discussed and pt agrees to proceed with the procedure.  Written consent obtained  These injections are medically necessary. Pt  receives good benefits from these injections. These injections do not cause sedations or hallucinations which the oral therapies may cause.  Description of procedure:  The patient was placed in a sitting position. The standard protocol was used for Botox as follows, with 5 units of Botox injected at each site:   -Procerus muscle, midline injection  -Corrugator muscle, bilateral injection  -Frontalis muscle, bilateral injection, with 2 sites each side, medial injection was performed in the upper one third of the frontalis muscle, in the region vertical from the medial inferior edge of the superior orbital rim. The lateral injection was again in the upper one  third of the forehead vertically above the lateral limbus of the cornea, 1.5 cm lateral to the medial injection site.  - Levator Scapulae: 5 units bilaterally  -Temporalis muscle  injection, 5 sites, bilaterally. The first injection was 3 cm above the tragus of the ear, second injection site was 1.5 cm to 3 cm up from the first injection site in line with the tragus of the ear. The third injection site was 1.5-3 cm forward between the first 2 injection sites. The fourth injection site was 1.5 cm posterior to the second injection site. 5th site laterally in the temporalis  muscleat the level of the outer canthus.  - Patient feels her clenching is a trigger for headaches. +5 units masseter bilaterally   - Patient feels the migraines are centered around the eyes +5 units bilaterally at the outer canthus in the orbicularis occuli  -Occipitalis muscle injection, 3 sites, bilaterally. The first injection was done one half way between the occipital protuberance and the tip of the mastoid process behind the ear. The second injection site was done lateral and superior to the first, 1 fingerbreadth from the first injection. The third injection site was 1 fingerbreadth superiorly and medially from the first injection site.  -Cervical paraspinal muscle injection, 2 sites, bilateral knee first injection site was 1 cm from the midline of the cervical spine, 3 cm inferior to the lower border of the occipital protuberance. The second injection site was 1.5 cm superiorly and laterally to the first injection site.  -Trapezius muscle injection was performed at 3 sites, bilaterally. The first injection site was in the upper trapezius muscle halfway between the inflection point of the neck, and the acromion. The second injection site was one half way between the acromion and the first injection site. The third injection was done between the first injection site and the inflection point of the neck.   Will return for repeat injection in 3 months.   A 200 unit sof Botox was used, any Botox not injected was wasted. The patient tolerated the procedure well, there were no complications of the above  procedure.

## 2020-02-10 ENCOUNTER — Ambulatory Visit: Payer: Medicaid Other | Attending: Neurology | Admitting: Physical Therapy

## 2020-02-10 ENCOUNTER — Encounter: Payer: Self-pay | Admitting: Physical Therapy

## 2020-02-10 ENCOUNTER — Other Ambulatory Visit: Payer: Self-pay

## 2020-02-10 DIAGNOSIS — R252 Cramp and spasm: Secondary | ICD-10-CM | POA: Diagnosis present

## 2020-02-10 DIAGNOSIS — G43711 Chronic migraine without aura, intractable, with status migrainosus: Secondary | ICD-10-CM | POA: Insufficient documentation

## 2020-02-10 DIAGNOSIS — R293 Abnormal posture: Secondary | ICD-10-CM | POA: Diagnosis present

## 2020-02-11 ENCOUNTER — Encounter: Payer: Self-pay | Admitting: Physical Therapy

## 2020-02-11 NOTE — Therapy (Signed)
[place], [place], [address] Phone: [phone]   Fax:  [phone]  Physical Therapy Evaluation  Patient Details  Name: Kelly Marsh MRN: [id number] Date of Birth: Jul 02, 1976 Referring Provider (PT): Dr Sarina Ill    Encounter Date: 02/10/2020  PT End of Session - 02/10/20 0815    Visit Number  1    Number of Visits  4    Date for PT Re-Evaluation  03/09/20    Authorization Type  Mediciad 3 visits per Medicaid gudilines    PT Start Time  0801    PT Stop Time  0845    PT Time Calculation (min)  44 min    Activity Tolerance  Patient tolerated treatment well    Behavior During Therapy  [facility] for tasks assessed/performed       Past Medical History:  Diagnosis Date  . Fatigue   . Migraine   . Snoring   . Vitamin B12 deficiency   . Vitamin D deficiency     Past Surgical History:  Procedure Laterality Date  . NO PAST SURGERIES      There were no vitals filed for this visit.   Subjective Assessment - 02/10/20 0807    Subjective  Patient has a long history of migranes. She has a headache about 1x  a week but they can last 3-4 days. Her triggers are stress, different smells, and the weather.  She does not currently have a headache. she had one 2 days ago. She is taking Botox right now which helps but they have not stopped the headaches. No medications at this time have helped.    Pertinent History  fatigue, Migranes, B12 deficiency,    How long can you sit comfortably?  N/A    How long can you stand comfortably?  N/A    How long can you walk comfortably?  N/A    Diagnostic tests  X-ray: cervical spine unremarkable    Patient Stated Goals  Less pain    Currently in Pain?  Yes   does not currently have pain   Pain Score  10-Worst pain ever   pain can reach a 10/10   Pain Location  Head    Pain Orientation  Left    Pain Type  Chronic pain    Pain Onset  More than a month ago    Pain Frequency   Constant    Aggravating Factors   Stress, certain smalls and food    Pain Relieving Factors  nothing    Effect of Pain on Daily Activities  When she has headahces she is unable to perfrom daily activity         [facility] PT Assessment - 02/11/20 0001      Assessment   Medical Diagnosis  Migraine Headaches     Referring Provider (PT)  Dr Sarina Ill     Onset Date/Surgical Date  --   for 20 years    Hand Dominance  Right    Next MD Visit  3 months     Prior Therapy  none       Precautions   Precautions  None      Restrictions   Weight Bearing Restrictions  No      Balance Screen   Has the patient fallen in the past 6 months  No    Has the patient had a decrease in activity level because of a fear of falling?   No  Is the patient reluctant to leave their home because of a fear of falling?   No      Home Environment   Additional Comments  nothing significant       Prior Function   Level of Independence  Independent    Vocation  Unemployed    Leisure  would like to exercise but can cause headaches       Cognition   Overall Cognitive Status  Within Functional Limits for tasks assessed    Attention  Focused    Focused Attention  Appears intact    Memory  Appears intact    Awareness  Appears intact    Problem Solving  Appears intact      Observation/Other Assessments   Focus on Therapeutic Outcomes (FOTO)   Mediciad       Sensation   Light Touch  Appears Intact    Additional Comments  deines parathesias       Coordination   Gross Motor Movements are Fluid and Coordinated  Yes    Fine Motor Movements are Fluid and Coordinated  Yes      Posture/Postural Control   Posture Comments  rounded shoulders/ Forward head       AROM   Overall AROM Comments  full active internal and external rotation but pain into the left upper trap wihen reachning behind the back and head     Cervical Flexion  20    Cervical Extension  27    Cervical - Right Rotation  65    Cervical -  Left Rotation  60       Strength   Right Shoulder Flexion  4+/5    Right Shoulder Internal Rotation  4+/5    Right Shoulder External Rotation  4+/5    Left Shoulder Flexion  4+/5    Left Shoulder Internal Rotation  4+/5    Left Shoulder External Rotation  4+/5      Palpation   Palpation comment  spasming L>R into upper trap, cervical paraspinals, and peri-scpaular srea                   Objective measurements completed on examination: See above findings.      [place] Adult PT Treatment/Exercise - 02/11/20 0001      Self-Care   Self-Care  Other Self-Care Comments    Other Self-Care Comments   reviewed use of thera-canefor self  trigger point release       Manual Therapy   Manual Therapy  Soft tissue mobilization;Manual Traction    Soft tissue mobilization  to upper trap and cerival paraspinals     Manual Traction  gentle sub-occipital release and gentle manual traction       Neck Exercises: Stretches   Upper Trapezius Stretch Limitations  3x20 sec hold     Levator Stretch Limitations  3x20 sec hold              PT Education - 02/11/20 [phone]    Education Details  beneftis and risk of TPDN, beneifts of posterior chain strengthening for cervical muscle tightness    Person(s) Educated  Patient    Methods  Explanation;Demonstration;Tactile cues;Verbal cues    Comprehension  Verbalized understanding;Returned demonstration;Verbal cues required;Tactile cues required       PT Short Term Goals - 02/10/20 1404      PT SHORT TERM GOAL #1   Title  Patient will increase left cervical rotation by 10 degrees    Baseline  60  left    Time  3    Period  Weeks    Status  New    Target Date  03/02/20      PT SHORT TERM GOAL #2   Title  Patient will deomnstrate 5/5 gorss bilateral shoulder strength    Baseline  4+/5 bilateral flexion and ER    Time  3    Period  Weeks    Status  New    Target Date  03/02/20      PT SHORT TERM GOAL #3   Title  Patient will report  a 25% reduction in headache intensity    Baseline  10/10 at leat 1x a week for 3-4 days    Time  3    Period  Weeks    Status  New    Target Date  03/02/20        PT Long Term Goals - 02/11/20 0644      PT LONG TERM GOAL #1   Title  Patient will reports a 50% reduction in headache intensity and duration    Baseline  has a headche 1x a weke for 3-4 days at 8-10/10 pain    Time  6    Period  Weeks    Status  New    Target Date  03/24/20      PT LONG TERM GOAL #2   Title  Patient will be independent with a stretching and postural correction program to adress the musculoskeletal portion of her migranes    Time  6    Period  Weeks    Status  New    Target Date  03/24/20             Plan - 02/10/20 1018    Clinical Impression Statement  Patient is a 45 year old female with chronic migranes. Her pain runs up her neck and can follow a ram horn pattern or settle behind her eye. Her triggers can be exercises and household activity, as well as food smells and stress. She has significant cervical muscluature tightness in her upper trap, cervical parapsinals,m,and sub-occipitals. She would benefit from skilled therapy for trigger point dry needling as well as a postrual correction program.    Personal Factors and Comorbidities  Comorbidity 2;Fitness;Comorbidity 1    Comorbidities  genral fatigue, chroninc migraies, unable to go to the gym    Examination-Activity Limitations  Sleep;Carry;Other    Examination-Participation Restrictions  Shop;Community Activity;Meal Prep;Cleaning    Stability/Clinical Decision Making  Evolving/Moderate complexity   debilitating headaches weekly   Clinical Decision Making  Moderate    Rehab Potential  Good    PT Frequency  1x / week    PT Duration  3 weeks   per mediciad   PT Treatment/Interventions  ADLs/Self Care Home Management;Cryotherapy;Electrical Stimulation;Moist Heat;Traction;Ultrasound;Functional mobility training;Therapeutic  activities;Therapeutic exercise;Manual techniques;Passive range of motion;Dry needling;Taping    PT Next Visit Plan  TPDN to upper trpas, paraspinals, and sub-occipitals; begin light postrual correction; review HEP;    PT Home Exercise Plan  reviewed use of thera-cane for self TPDN, reviewed upper trap and levator stretching for HEP    Consulted and Agree with Plan of Care  Patient       Patient will benefit from skilled therapeutic intervention in order to improve the following deficits and impairments:  Decreased endurance, Difficulty walking, Decreased activity tolerance, Pain, Postural dysfunction, Improper body mechanics, Decreased strength  Visit Diagnosis: Chronic migraine without aura, with intractable migraine, so stated,  with status migrainosus  Cramp and spasm  Abnormal posture     Problem List Patient Active Problem List   Diagnosis Date Noted  . Chronic migraine without aura, with intractable migraine, so stated, with status migrainosus 12/06/2017  . Chronic migraine without aura without status migrainosus, not intractable 10/26/2015  . Migraine with aura and with status migrainosus, not intractable 04/13/2015  . Blurry vision, bilateral 04/13/2015  . Perceived hearing changes 04/13/2015  . Daily headache 04/13/2015    Carney Living PT DPT  02/11/2020, 6:49 AM  [facility] [address] [place], [place], [address] Phone: [phone]   Fax:  [phone]  Name: Kelly Marsh MRN: [id number] Date of Birth: May 07, 1976

## 2020-02-26 ENCOUNTER — Other Ambulatory Visit: Payer: Self-pay

## 2020-02-26 ENCOUNTER — Ambulatory Visit: Payer: Medicaid Other | Attending: Neurology

## 2020-02-26 DIAGNOSIS — G43711 Chronic migraine without aura, intractable, with status migrainosus: Secondary | ICD-10-CM | POA: Insufficient documentation

## 2020-02-26 DIAGNOSIS — R252 Cramp and spasm: Secondary | ICD-10-CM | POA: Insufficient documentation

## 2020-02-26 DIAGNOSIS — R293 Abnormal posture: Secondary | ICD-10-CM | POA: Insufficient documentation

## 2020-02-29 NOTE — Therapy (Signed)
[place], [place], [address] Phone: [phone]   Fax:  [phone]  Physical Therapy Treatment  Patient Details  Name: Kelly Marsh MRN: [id number] Date of Birth: 1976-09-02 Referring Provider (PT): Dr Sarina Ill    Encounter Date: 02/26/2020   PT End of Session - 02/29/20 2305    Visit Number 2    Number of Visits 4    Date for PT Re-Evaluation 03/09/20    Authorization Type Medicaid 3 visits per Medicaid gudilines    PT Start Time 1941    PT Stop Time 0845    PT Time Calculation (min) 58 min    Activity Tolerance Patient tolerated treatment well    Behavior During Therapy [facility] for tasks assessed/performed           Past Medical History:  Diagnosis Date  . Fatigue   . Migraine   . Snoring   . Vitamin B12 deficiency   . Vitamin D deficiency     Past Surgical History:  Procedure Laterality Date  . NO PAST SURGERIES      There were no vitals filed for this visit.   Subjective Assessment - 02/29/20 2327    Subjective Pt reports no significant shange in her HAs since the Eval.    Currently in Pain? Yes    Pain Score 10-Worst pain ever    Pain Location Head    Pain Orientation Left;Posterior    Pain Descriptors / Indicators Headache    Pain Type Chronic pain    Pain Onset More than a month ago    Pain Frequency Constant                             [facility] Adult PT Treatment/Exercise - 02/29/20 0001      Exercises   Exercises Neck;Shoulder      Neck Exercises: Seated   Neck Retraction 10 reps;3 secs      Neck Exercises: Supine   Neck Retraction 10 reps;3 secs      Shoulder Exercises: Seated   Retraction Strengthening;10 reps    Retraction Limitations 5 sec      Shoulder Exercises: Stretch   Other Shoulder Stretches --    Other Shoulder Stretches --      Modalities   Modalities Moist Heat      Moist Heat Therapy   Number Minutes Moist Heat 15 Minutes     Moist Heat Location Cervical      Manual Therapy   Manual Therapy Soft tissue mobilization;Manual Traction    Soft tissue mobilization Gentle STW to the sub-occipitals, cervical paraspinals, upper trap, and levator scapulae    Manual Traction Gentle sub-occipital distraction      Neck Exercises: Stretches   Upper Trapezius Stretch Limitations 3x20 sec hold     Levator Stretch Limitations 3x20 sec hold                   PT Education - 02/29/20 2302    Education Details HEP: postural exs for scapular retractions and cervical retraction (either in sitting or supine). The anatomy of the sub-occipital triangle and it's potential as a source of HAs originating at the base of her head. use of a single tennis ball for mid bck massage, and 2 traped tennis balls for massage to the sub-occipital area.    Person(s) Educated Patient    Methods Explanation;Demonstration;Tactile cues;Verbal cues;Handout    Comprehension  Verbalized understanding;Returned demonstration;Verbal cues required;Tactile cues required;Need further instruction            PT Short Term Goals - 02/10/20 1404      PT SHORT TERM GOAL #1   Title Patient will increase left cervical rotation by 10 degrees    Baseline 60 left    Time 3    Period Weeks    Status New    Target Date 03/02/20      PT SHORT TERM GOAL #2   Title Patient will deomnstrate 5/5 gorss bilateral shoulder strength    Baseline 4+/5 bilateral flexion and ER    Time 3    Period Weeks    Status New    Target Date 03/02/20      PT SHORT TERM GOAL #3   Title Patient will report a 25% reduction in headache intensity    Baseline 10/10 at leat 1x a week for 3-4 days    Time 3    Period Weeks    Status New    Target Date 03/02/20             PT Long Term Goals - 02/11/20 0644      PT LONG TERM GOAL #1   Title Patient will reports a 50% reduction in headache intensity and duration    Baseline has a headche 1x a weke for 3-4 days at  8-10/10 pain    Time 6    Period Weeks    Status New    Target Date 03/24/20      PT LONG TERM GOAL #2   Title Patient will be independent with a stretching and postural correction program to adress the musculoskeletal portion of her migranes    Time 6    Period Weeks    Status New    Target Date 03/24/20                 Plan - 02/29/20 2330    Clinical Impression Statement PT focused on addressing posture and posterior HAs throungh cervical and scapular retraction exs. Pt returned demonstration of her new exs. Pt was able to complete cervical retactions with greater ease in supine vs. sitting. Pt was able to only tolerate light massge and light sub-occipital traction.    Personal Factors and Comorbidities Comorbidity 2;Fitness;Comorbidity 1    Comorbidities genral fatigue, chroninc migraies, unable to go to the gym    Examination-Activity Limitations Sleep;Carry;Other    Examination-Participation Restrictions Shop;Community Activity;Meal Prep;Cleaning    Stability/Clinical Decision Making Evolving/Moderate complexity    Clinical Decision Making Moderate    PT Frequency 1x / week    PT Duration 3 weeks    PT Treatment/Interventions ADLs/Self Care Home Management;Cryotherapy;Electrical Stimulation;Moist Heat;Traction;Ultrasound;Functional mobility training;Therapeutic activities;Therapeutic exercise;Manual techniques;Passive range of motion;Dry needling;Taping    PT Next Visit Plan Assess response to today's session and new HEPexs. Assess need and provide TPDN as indicated.    PT Home Exercise Plan [id number].           Patient will benefit from skilled therapeutic intervention in order to improve the following deficits and impairments:  Decreased endurance, Difficulty walking, Decreased activity tolerance, Pain, Postural dysfunction, Improper body mechanics, Decreased strength  Visit Diagnosis: Chronic migraine without aura, with intractable migraine, so stated, with status  migrainosus  Cramp and spasm  Abnormal posture     Problem List Patient Active Problem List   Diagnosis Date Noted  . Chronic migraine without aura, with intractable migraine, so stated, with  status migrainosus 12/06/2017  . Chronic migraine without aura without status migrainosus, not intractable 10/26/2015  . Migraine with aura and with status migrainosus, not intractable 04/13/2015  . Blurry vision, bilateral 04/13/2015  . Perceived hearing changes 04/13/2015  . Daily headache 04/13/2015    Gar Ponto MS, PT 02/29/20 11:54 PM  [facility] [address] [place], [place], [address] Phone: [phone]   Fax:  [phone]  Name: Kelly Marsh MRN: [id number] Date of Birth: May 26, 1976

## 2020-03-05 ENCOUNTER — Ambulatory Visit: Payer: Medicaid Other | Admitting: Physical Therapy

## 2020-03-05 ENCOUNTER — Encounter: Payer: Self-pay | Admitting: Physical Therapy

## 2020-03-05 ENCOUNTER — Other Ambulatory Visit: Payer: Self-pay

## 2020-03-05 DIAGNOSIS — G43711 Chronic migraine without aura, intractable, with status migrainosus: Secondary | ICD-10-CM | POA: Diagnosis not present

## 2020-03-05 DIAGNOSIS — R293 Abnormal posture: Secondary | ICD-10-CM

## 2020-03-05 DIAGNOSIS — R252 Cramp and spasm: Secondary | ICD-10-CM

## 2020-03-05 NOTE — Therapy (Signed)
[place], [place], [address] Phone: [phone]   Fax:  [phone]  Physical Therapy Treatment  Patient Details  Name: Kelly Marsh MRN: [id number] Date of Birth: 18-Dec-1975 Referring Provider (PT): Dr Sarina Ill    Encounter Date: 03/05/2020   PT End of Session - 03/05/20 0834    Visit Number 3    Number of Visits 4    Date for PT Re-Evaluation 03/09/20    Authorization Type Medicaid 3 visits per Medicaid gudilines    PT Start Time 0800    PT Stop Time 0858    PT Time Calculation (min) 58 min    Activity Tolerance Patient tolerated treatment well    Behavior During Therapy [facility] for tasks assessed/performed           Past Medical History:  Diagnosis Date  . Fatigue   . Migraine   . Snoring   . Vitamin B12 deficiency   . Vitamin D deficiency     Past Surgical History:  Procedure Laterality Date  . NO PAST SURGERIES      There were no vitals filed for this visit.   Subjective Assessment - 03/05/20 0805    Subjective Patient had a migrnae a few days ago. She has not noticed a significant difference but has been using the tennis ball which has helped.    Pertinent History fatigue, Migranes, B12 deficiency,    Currently in Pain? No/denies                             [facility] Adult PT Treatment/Exercise - 03/05/20 0001      Shoulder Exercises: Standing   Other Standing Exercises scap retraction yellow 2x10; shoulder extension yellow 2x10;       Moist Heat Therapy   Number Minutes Moist Heat 15 Minutes    Moist Heat Location Cervical      Manual Therapy   Manual therapy comments skilled palapation of trigger poitns     Soft tissue mobilization Gentle STW to the sub-occipitals, cervical paraspinals, upper trap, and levator scapulae    Manual Traction Gentle sub-occipital distraction and release       Neck Exercises: Stretches   Upper Trapezius Stretch Limitations 3x20  sec hold     Levator Stretch Limitations 3x20 sec hold             Trigger Point Dry Needling - 03/05/20 0001    Consent Given? Yes    Education Handout Provided Yes    Muscles Treated Head and Neck Upper trapezius;Suboccipitals;Cervical multifidi    Other Dry Needling 1 needle in each spot on each side 6 needles using a 40x.25 needle     Upper Trapezius Response Twitch reponse elicited;Palpable increased muscle length    Suboccipitals Response Twitch response elicited    Cervical multifidi Response Twitch reponse elicited                  PT Short Term Goals - 02/10/20 1404      PT SHORT TERM GOAL #1   Title Patient will increase left cervical rotation by 10 degrees    Baseline 60 left    Time 3    Period Weeks    Status New    Target Date 03/02/20      PT SHORT TERM GOAL #2   Title Patient will deomnstrate 5/5 gorss bilateral shoulder strength    Baseline 4+/5 bilateral  flexion and ER    Time 3    Period Weeks    Status New    Target Date 03/02/20      PT SHORT TERM GOAL #3   Title Patient will report a 25% reduction in headache intensity    Baseline 10/10 at leat 1x a week for 3-4 days    Time 3    Period Weeks    Status New    Target Date 03/02/20             PT Long Term Goals - 02/11/20 0644      PT LONG TERM GOAL #1   Title Patient will reports a 50% reduction in headache intensity and duration    Baseline has a headche 1x a weke for 3-4 days at 8-10/10 pain    Time 6    Period Weeks    Status New    Target Date 03/24/20      PT LONG TERM GOAL #2   Title Patient will be independent with a stretching and postural correction program to adress the musculoskeletal portion of her migranes    Time 6    Period Weeks    Status New    Target Date 03/24/20                 Plan - 03/05/20 6378    Clinical Impression Statement Patient had a great twitch respose of the uper trap and a fari in the sub-occipitals and cervical multifidi.  Therapy reviewed stretching with her to prevent post needle soreness. She was also given light resistance for her postrual exercises. overall she tolerated treatment well. per visual inspection she had full cervical rotation after treatment.    Comorbidities genral fatigue, chroninc migraies, unable to go to the gym    Examination-Activity Limitations Sleep;Carry;Other    Examination-Participation Restrictions Shop;Community Activity;Meal Prep;Cleaning    Stability/Clinical Decision Making Evolving/Moderate complexity    Clinical Decision Making Moderate    Rehab Potential Good    PT Frequency 1x / week    PT Duration 3 weeks    PT Treatment/Interventions ADLs/Self Care Home Management;Cryotherapy;Electrical Stimulation;Moist Heat;Traction;Ultrasound;Functional mobility training;Therapeutic activities;Therapeutic exercise;Manual techniques;Passive range of motion;Dry needling;Taping    PT Next Visit Plan Assess response to today's session and new HEPexs. Assess need and provide TPDN as indicated.; re-asses next visit for Mediciad    PT Home Exercise Plan Access Code: [email or web address].Shoulder extension with resistance - Neutral - 1 x daily - 7 x weekly - 3 sets - 10 reps.Scapular Retraction with Resistance - 1 x daily - 7 x weekly - 3 sets - 10 reps    Consulted and Agree with Plan of Care Patient           Patient will benefit from skilled therapeutic intervention in order to improve the following deficits and impairments:  Decreased endurance, Difficulty walking, Decreased activity tolerance, Pain, Postural dysfunction, Improper body mechanics, Decreased strength  Visit Diagnosis: Chronic migraine without aura, with intractable migraine, so stated, with status migrainosus  Cramp and spasm  Abnormal posture     Problem List Patient Active Problem List   Diagnosis Date Noted  . Chronic migraine without aura, with intractable migraine, so stated, with status migrainosus 12/06/2017  .  Chronic migraine without aura without status migrainosus, not intractable 10/26/2015  . Migraine with aura and with status migrainosus, not intractable 04/13/2015  . Blurry vision, bilateral 04/13/2015  . Perceived hearing changes 04/13/2015  . Daily headache 04/13/2015  Carney Living PT DPT  03/05/2020, 11:34 AM  [facility] [address] [place], [place], [address] Phone: [phone]   Fax:  [phone]  Name: Kelly Marsh MRN: [id number] Date of Birth: 24-Oct-1975

## 2020-03-11 ENCOUNTER — Other Ambulatory Visit: Payer: Self-pay

## 2020-03-11 ENCOUNTER — Ambulatory Visit: Payer: Medicaid Other | Admitting: Physical Therapy

## 2020-03-11 ENCOUNTER — Encounter: Payer: Self-pay | Admitting: Physical Therapy

## 2020-03-11 DIAGNOSIS — G43711 Chronic migraine without aura, intractable, with status migrainosus: Secondary | ICD-10-CM | POA: Diagnosis not present

## 2020-03-11 DIAGNOSIS — R252 Cramp and spasm: Secondary | ICD-10-CM

## 2020-03-11 DIAGNOSIS — R293 Abnormal posture: Secondary | ICD-10-CM

## 2020-03-11 NOTE — Therapy (Addendum)
[place], [place], [address] Phone: [phone]   Fax:  [phone]  Physical Therapy Treatment/Discharge   Patient Details  Name: Gennette Shadix MRN: [id number] Date of Birth: [birth year] Referring Provider (PT): Dr Sarina Ill    Encounter Date: 03/11/2020   PT End of Session - 03/11/20 0850    Visit Number 4    Number of Visits 10    Date for PT Re-Evaluation 03/09/20    Authorization Type re-submitted    PT Start Time 0848    PT Stop Time 0930    PT Time Calculation (min) 42 min    Activity Tolerance Patient tolerated treatment well    Behavior During Therapy [facility] for tasks assessed/performed           Past Medical History:  Diagnosis Date  . Fatigue   . Migraine   . Snoring   . Vitamin B12 deficiency   . Vitamin D deficiency     Past Surgical History:  Procedure Laterality Date  . NO PAST SURGERIES      There were no vitals filed for this visit.   Subjective Assessment - 03/11/20 0850    Subjective Patient stated she felt better after last session, but pain returned yesterday. Pain is in the upper traps and up into the neck. No headache today.    Pertinent History fatigue, Migranes, B12 deficiency,    How long can you sit comfortably? N/A    How long can you stand comfortably? N/A    How long can you walk comfortably? N/A    Diagnostic tests X-ray: cervical spine unremarkable    Patient Stated Goals Less pain    Currently in Pain? No/denies              [facility] PT Assessment - 03/11/20 0001      AROM   Cervical Flexion 26    Cervical Extension 20    Cervical - Right Rotation 68    Cervical - Left Rotation 70      Strength   Right Shoulder Flexion 5/5    Right Shoulder Internal Rotation 5/5    Right Shoulder External Rotation 4+/5    Left Shoulder Flexion 5/5    Left Shoulder Internal Rotation 5/5    Left Shoulder External Rotation 4+/5                          [facility] Adult PT Treatment/Exercise - 03/11/20 0001      Shoulder Exercises: Prone   External Rotation 15 reps   red     Shoulder Exercises: Standing   External Rotation Limitations 2x10 red     Other Standing Exercises scap retraction red 2x10; shoulder extension red 2x10;       Manual Therapy   Manual Therapy Soft tissue mobilization;Manual Traction    Manual therapy comments skilled palapation of trigger poitns     Soft tissue mobilization Gentle STW to the sub-occipitals, cervical paraspinals, upper trap, and levator scapulae    Manual Traction Gentle sub-occipital distraction and release       Neck Exercises: Stretches   Upper Trapezius Stretch 2 reps;20 seconds    Levator Stretch 2 reps;20 seconds    Chest Stretch 2 reps;30 seconds            Trigger Point Dry Needling - 03/11/20 0001    Consent Given? Yes    Education Handout Provided Yes  Muscles Treated Head and Neck Upper trapezius;Suboccipitals;Cervical multifidi    Other Dry Needling 1 needle in each spot on each side 6 needles using a 40x.25 needle     Upper Trapezius Response Twitch reponse elicited;Palpable increased muscle length    Suboccipitals Response Twitch response elicited    Cervical multifidi Response Twitch reponse elicited                PT Education - 03/11/20 1439    Education Details reviewed technique with stretches and ther-ex    Person(s) Educated Patient    Methods Explanation;Demonstration;Tactile cues;Verbal cues    Comprehension Verbalized understanding;Returned demonstration;Verbal cues required;Tactile cues required            PT Short Term Goals - 03/11/20 1447      PT SHORT TERM GOAL #1   Title Patient will increase left cervical rotation by 10 degrees    Baseline 70 left    Time 3    Period Weeks    Status Achieved      PT SHORT TERM GOAL #2   Title Patient will deomnstrate 5/5 gorss bilateral shoulder strength    Baseline flexion  5/5 er 4+/5    Time 3    Period Weeks    Status On-going    Target Date 04/01/20      PT SHORT TERM GOAL #3   Title Patient will report a 25% reduction in headache intensity    Baseline recent headache only lasted 1-2 days with intensity of an 8/10    Time 3    Period Weeks    Status Partially Met    Target Date 03/02/20      PT SHORT TERM GOAL #4   Title Patient will increase gross cervical flexion and extension to 35 degrees             PT Long Term Goals - 03/11/20 1449      PT LONG TERM GOAL #1   Title Patient will reports a 50% reduction in headache intensity and duration    Baseline 1-2 days per heachache with an 8/10 pain    Time 6    Period Weeks    Status On-going      PT LONG TERM GOAL #2   Title Patient will be independent with a stretching and postural correction program to adress the musculoskeletal portion of her migranes    Baseline therapy continues to progress ther-ex    Time 6    Period Weeks    Status On-going                 Plan - 03/11/20 1441    Clinical Impression Statement Patient has made progress. She continues to have headaches about 1x a week but there intenisty has decreased. Her cerivical ROM has improved signiciantly with left rotation to 70 degrees and right to 68 degrees. Her cervical motion remains limited. She has improved shoulder flexion strength has improved to 5/5 as well as IR bilateral. She would benefit from further skilled therapy to continue to address the musculoskeletal triggers of her headaches. She would benefit from therapy 1W6 for continued dry needling and pistural correction.    Personal Factors and Comorbidities Comorbidity 2;Fitness;Comorbidity 1    Comorbidities genral fatigue, chroninc migraies, unable to go to the gym    Examination-Activity Limitations Sleep;Carry;Other    Examination-Participation Restrictions Shop;Community Activity;Meal Prep;Cleaning    Stability/Clinical Decision Making  Evolving/Moderate complexity    Clinical Decision Making Moderate  Rehab Potential Good    PT Frequency 1x / week    PT Duration 3 weeks    PT Treatment/Interventions ADLs/Self Care Home Management;Cryotherapy;Electrical Stimulation;Moist Heat;Traction;Ultrasound;Functional mobility training;Therapeutic activities;Therapeutic exercise;Manual techniques;Passive range of motion;Dry needling;Taping    PT Next Visit Plan Assess response to today's session and new HEPexs. Assess need and provide TPDN as indicated.; re-asses next visit for Mediciad    PT Home Exercise Plan Access Code: [email or web address].Shoulder extension with resistance - Neutral - 1 x daily - 7 x weekly - 3 sets - 10 reps.Scapular Retraction with Resistance - 1 x daily - 7 x weekly - 3 sets - 10 reps    Consulted and Agree with Plan of Care Patient           Patient will benefit from skilled therapeutic intervention in order to improve the following deficits and impairments:  Decreased endurance, Difficulty walking, Decreased activity tolerance, Pain, Postural dysfunction, Improper body mechanics, Decreased strength  Visit Diagnosis: Chronic migraine without aura, with intractable migraine, so stated, with status migrainosus  Cramp and spasm  Abnormal posture  PHYSICAL THERAPY DISCHARGE SUMMARY  Visits from Start of Care: 4  Current functional level related to goals / functional outcomes: Patient did not return    Remaining deficits: Unknown    Education / Equipment: Unknown   Plan: Patient agrees to discharge.  Patient goals were not met. Patient is being discharged due to meeting the stated rehab goals.  ?????       Problem List Patient Active Problem List   Diagnosis Date Noted  . Chronic migraine without aura, with intractable migraine, so stated, with status migrainosus 12/06/2017  . Chronic migraine without aura without status migrainosus, not intractable 10/26/2015  . Migraine with aura and with  status migrainosus, not intractable 04/13/2015  . Blurry vision, bilateral 04/13/2015  . Perceived hearing changes 04/13/2015  . Daily headache 04/13/2015    Carney Living  PT DPT  03/11/2020, 2:53 PM  [facility] [address] [place], [place], [address] Phone: [phone]   Fax:  [phone]  Name: Willette Mudry MRN: [id number] Date of Birth: May 14, 1976

## 2020-04-12 ENCOUNTER — Telehealth: Payer: Self-pay | Admitting: Neurology

## 2020-04-12 NOTE — Telephone Encounter (Signed)
I called [facility] [phone]) and spoke with [place] about scheduling Botox delivery. She states that [facility] has been trying to contact the patient to get consent with no luck. I called the patient and was able to put her on a conference call with [place] at [facility]. Patient gave consent and we scheduled Botox delivery for 8/3 (tentatively). [place] informed me that patient does not have a PA on file. I checked [facility] and patient's last Botox PA expired on 6/28. I filled out new PA form and gave to MD to sign.

## 2020-04-13 NOTE — Telephone Encounter (Signed)
I faxed continuation request to [facility].

## 2020-04-29 NOTE — Telephone Encounter (Signed)
I called [facility] and spoke with Caylee about PA status. She states no Josem Kaufmann is required for J0585 and [phone]. I asked her which specialty pharmacy patient needs to use. She states patient can be B/B. She states they previously used [facility] but they no longer fill Botox. She gave me the name of another [place], [place]. Reference #[id number]. I called [facility] to see about getting patient enrolled with them. I spoke with Merrilee Seashore, who told me that [facility] is not contracted with [facility] so [facility] is not able to fill this medication. I asked him if his system showed him what specialty pharmacy the patient should be using. He states [facility]. It seems patient does not have a pharmacy to use. I will call [facility] back on Monday to see if there are any other possible options. Patient will have to be B/B for her Tuesday appointment because Botox does not have enough time to get reviewed by a pharmacy and here by Tuesday. FYI Angie

## 2020-04-29 NOTE — Telephone Encounter (Signed)
FYI: Patient's insurance changed when Medicaid made changes on 7/1. Her Botox can no longer be filled at [facility].

## 2020-05-03 NOTE — Telephone Encounter (Signed)
I called the patient and got her rescheduled to 8/26 at 4:00.

## 2020-05-03 NOTE — Telephone Encounter (Signed)
Sure. thanks 

## 2020-05-03 NOTE — Telephone Encounter (Signed)
I called [facility] to verify this information just to make sure. I spoke with Andee Poles who states that the specialty pharmacy patient should use is [facility]). I called them [phone]) and spoke with [facility] and explained the situation to her. I told her I called the other day and spoke with a different rep who told me that [facility] is not contracted with [facility]. She put me on hold for 15 minutes to speak with her manager. She came back and told me that her manager states that she is not sure why I was told that [facility] is not contracted with [facility]. Myna Bright states that she will send this information to the Product manager and they will reach out with more information. I asked her for a reference number and she told me that I could just use her name, Christiana Pellant.   I will have to call patient and reschedule her appointment due to this issue.

## 2020-05-03 NOTE — Telephone Encounter (Signed)
Please see notes. I have been trying to get this worked out with insurance since the end of July. Could I make a 4:00 spot for patient on either August 26th or 30th?

## 2020-05-04 ENCOUNTER — Ambulatory Visit: Payer: Medicaid Other | Admitting: Neurology

## 2020-05-04 MED ORDER — BOTOX 100 UNITS IJ SOLR: INTRAMUSCULAR | 1 refills | Status: AC

## 2020-05-04 MED ORDER — BOTOX 100 UNITS IJ SOLR: INTRAMUSCULAR | 1 refills | Status: DC

## 2020-05-04 NOTE — Telephone Encounter (Signed)
Unable to locate that pharmacy to e-scribe. I sent the Botox Rx to:  [facility] [address], [place], [place]. [address]  [place] [address], [place] [address]  Phone:  [phone] Fax:  [phone]   [facility] aware.

## 2020-05-04 NOTE — Addendum Note (Signed)
Addended by: Gildardo Griffes on: 05/04/2020 01:58 PM   Modules accepted: Orders

## 2020-05-04 NOTE — Telephone Encounter (Signed)
Kelly Marsh,  Can you sent patient's prescription to [facility]? We can try to see if they will fill it.   [facility] [address] [place] [address] Fax: [phone]

## 2020-05-04 NOTE — Addendum Note (Signed)
Addended by: Gildardo Griffes on: 05/04/2020 02:18 PM   Modules accepted: Orders

## 2020-05-05 NOTE — Telephone Encounter (Signed)
I called [facility] again today to check status. I spoke with Myna Bright again and she states she did send an e-mail over to the insurance department. She states they attempted to call the patient's plan yesterday after business hours. I asked her if they could expedite this process as patient has an appointment coming up on 8/26. She states she will send them another e-mail to request this.

## 2020-05-10 NOTE — Telephone Encounter (Signed)
I received a fax from [facility] stating that medication delivery is ready to be scheduled. I called [phone]) and spoke with Malachy Mood. I was able to get medication delivery set up for tomorrow, 8/24, for patient's 8/26 appointment.

## 2020-05-11 NOTE — Telephone Encounter (Signed)
Medication was not received today. I called [facility] and spoke with Estill Bamberg to find out why. She states the order was voided because when the pharmacy attempted to run a claim, the patient's insurance ID was rejected. I read Estill Bamberg the patient's ID from her Medicaid card. She states that they were missing the letter character at the end and she apologized that we were not contacted when the order was voided. She updated the ID in the system and states I will get a call this afternoon before 5 to set up new delivery.

## 2020-05-12 NOTE — Telephone Encounter (Signed)
I called [facility] again this morning to check the status. I did not receive a call back yesterday. I spoke with Erline Levine who stated that she could not see any notes from the representative I talked to yesterday. She states she will contact the manager of insurance department and get it pushed through to schedule delivery.

## 2020-05-13 ENCOUNTER — Other Ambulatory Visit: Payer: Self-pay

## 2020-05-13 ENCOUNTER — Ambulatory Visit: Payer: Medicaid Other | Admitting: Neurology

## 2020-05-13 ENCOUNTER — Telehealth: Payer: Self-pay | Admitting: Neurology

## 2020-05-13 DIAGNOSIS — G43711 Chronic migraine without aura, intractable, with status migrainosus: Secondary | ICD-10-CM | POA: Diagnosis not present

## 2020-05-13 NOTE — Telephone Encounter (Signed)
Patient rescheduled to earlier today

## 2020-05-13 NOTE — Progress Notes (Signed)
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

## 2020-05-13 NOTE — Progress Notes (Signed)
Consent Form Botulism Toxin Injection For Chronic Migraine  Interval history 05/13/2020: has a tooth infection and getting more migraines since then but still she has done exceptionally well with this treatment. >50% reduction in frequency of migraines and headaches since starting botox. Patient has tried multiple acute meds.Clenching is a problem. She feels as though she still has severe headaches, nothing we have given her has helped with acute management and the migraines she still gets are severe we will refer to [place].   [address]: [place]  Reviewed orally with patient, additionally signature is on file:  Botulism toxin has been approved by the Federal drug administration for treatment of chronic migraine. Botulism toxin does not cure chronic migraine and it may not be effective in some patients.  The administration of botulism toxin is accomplished by injecting a small amount of toxin into the muscles of the neck and head. Dosage must be titrated for each individual. Any benefits resulting from botulism toxin tend to wear off after 3 months with a repeat injection required if benefit is to be maintained. Injections are usually done every 3-4 months with maximum effect peak achieved by about 2 or 3 weeks. Botulism toxin is expensive and you should be sure of what costs you will incur resulting from the injection.  The side effects of botulism toxin use for chronic migraine may include:   -Transient, and usually mild, facial weakness with facial injections  -Transient, and usually mild, head or neck weakness with head/neck injections  -Reduction or loss of forehead facial animation due to forehead muscle weakness  -Eyelid drooping  -Dry eye  -Pain at the site of injection or bruising at the site of injection  -Double vision  -Potential unknown long term risks  Contraindications: You should not have Botox if you are pregnant, nursing, allergic to albumin, have an  infection, skin condition, or muscle weakness at the site of the injection, or have myasthenia gravis, Lambert-Eaton syndrome, or ALS.  It is also possible that as with any injection, there may be an allergic reaction or no effect from the medication. Reduced effectiveness after repeated injections is sometimes seen and rarely infection at the injection site may occur. All care will be taken to prevent these side effects. If therapy is given over a long time, atrophy and wasting in the muscle injected may occur. Occasionally the patient's become refractory to treatment because they develop antibodies to the toxin. In this event, therapy needs to be modified.  I have read the above information and consent to the administration of botulism toxin.    BOTOX PROCEDURE NOTE FOR MIGRAINE HEADACHE    Contraindications and precautions discussed with patient(above). Aseptic procedure was observed and patient tolerated procedure. Procedure performed by Dr. Georgia Dom  The condition has existed for more than 6 months, and pt does not have a diagnosis of ALS, Myasthenia Gravis or Lambert-Eaton Syndrome.  Risks and benefits of injections discussed and pt agrees to proceed with the procedure.  Written consent obtained  These injections are medically necessary. Pt  receives good benefits from these injections. These injections do not cause sedations or hallucinations which the oral therapies may cause.  Description of procedure:  The patient was placed in a sitting position. The standard protocol was used for Botox as follows, with 5 units of Botox injected at each site:   -Procerus muscle, midline injection  -Corrugator muscle, bilateral injection  -Frontalis muscle, bilateral injection, with 2 sites each side,  medial injection was performed in the upper one third of the frontalis muscle, in the region vertical from the medial inferior edge of the superior orbital rim. The lateral injection was again in  the upper one third of the forehead vertically above the lateral limbus of the cornea, 1.5 cm lateral to the medial injection site.  - Levator Scapulae: 5 units bilaterally  -Temporalis muscle injection, 5 sites, bilaterally. The first injection was 3 cm above the tragus of the ear, second injection site was 1.5 cm to 3 cm up from the first injection site in line with the tragus of the ear. The third injection site was 1.5-3 cm forward between the first 2 injection sites. The fourth injection site was 1.5 cm posterior to the second injection site. 5th site laterally in the temporalis  muscleat the level of the outer canthus.  - Patient feels her clenching is a trigger for headaches. +5 units masseter bilaterally   - Patient feels the migraines are centered around the eyes +5 units bilaterally at the outer canthus in the orbicularis occuli  -Occipitalis muscle injection, 3 sites, bilaterally. The first injection was done one half way between the occipital protuberance and the tip of the mastoid process behind the ear. The second injection site was done lateral and superior to the first, 1 fingerbreadth from the first injection. The third injection site was 1 fingerbreadth superiorly and medially from the first injection site.  -Cervical paraspinal muscle injection, 2 sites, bilateral knee first injection site was 1 cm from the midline of the cervical spine, 3 cm inferior to the lower border of the occipital protuberance. The second injection site was 1.5 cm superiorly and laterally to the first injection site.  -Trapezius muscle injection was performed at 3 sites, bilaterally. The first injection site was in the upper trapezius muscle halfway between the inflection point of the neck, and the acromion. The second injection site was one half way between the acromion and the first injection site. The third injection was done between the first injection site and the inflection point of the neck.   Will  return for repeat injection in 3 months.   A 200 unit sof Botox was used, any Botox not injected was wasted. The patient tolerated the procedure well, there were no complications of the above procedure.

## 2020-06-01 ENCOUNTER — Telehealth: Payer: Self-pay | Admitting: Neurology

## 2020-06-01 NOTE — Telephone Encounter (Signed)
Faxed signed appeal form to [facility].

## 2020-06-01 NOTE — Telephone Encounter (Signed)
I received a denial letter via fax from [facility]. I called [facility] to get clarification on this denial letter because I was told that Botox does not require a PA. I spoke with Charisse March, who advised that if the patient is B/B, PA is not required. If the patient will use a pharmacy, PA will be required. Patient was B/B for 8/26 appointment, but I will submit an appeal to [facility] so that patient can use [facility]. I filled out the appeal letter and faxed it along with clinical notes to [phone], the fax number that Tashia provided me with. Reference #[id number].

## 2020-06-10 NOTE — Progress Notes (Deleted)
Office Visit Note  Patient: Kelly Marsh             Date of Birth: [birth year]           MRN: [id number]             PCP: Simona Huh, NP Referring: Simona Huh, NP Visit Date: 06/24/2020 Occupation: @GUAROCC @  Subjective:  No chief complaint on file.   History of Present Illness: Kelly Marsh is a 44 y.o. female ***   Activities of Daily Living:  Patient reports morning stiffness for *** {minute/hour:19697}.   Patient {ACTIONS;DENIES/REPORTS:21021675::"Denies"} nocturnal pain.  Difficulty dressing/grooming: {ACTIONS;DENIES/REPORTS:21021675::"Denies"} Difficulty climbing stairs: {ACTIONS;DENIES/REPORTS:21021675::"Denies"} Difficulty getting out of chair: {ACTIONS;DENIES/REPORTS:21021675::"Denies"} Difficulty using hands for taps, buttons, cutlery, and/or writing: {ACTIONS;DENIES/REPORTS:21021675::"Denies"}  No Rheumatology ROS completed.   PMFS History:  Patient Active Problem List   Diagnosis Date Noted  . Chronic migraine without aura, with intractable migraine, so stated, with status migrainosus 12/06/2017  . Chronic migraine without aura without status migrainosus, not intractable 10/26/2015  . Migraine with aura and with status migrainosus, not intractable 04/13/2015  . Blurry vision, bilateral 04/13/2015  . Perceived hearing changes 04/13/2015  . Daily headache 04/13/2015    Past Medical History:  Diagnosis Date  . Fatigue   . Migraine   . Snoring   . Vitamin B12 deficiency   . Vitamin D deficiency     Family History  Problem Relation Age of Onset  . Diabetes Mother   . Cancer Maternal Grandmother   . Cancer Maternal Grandfather   . Diabetes Sister   . Diabetes Brother   . Cancer Brother   . Healthy Daughter   . Healthy Son   . Healthy Son   . Healthy Son   . OCD Son   . Healthy Son   . Migraines Neg Hx    Past Surgical History:  Procedure Laterality Date  . NO PAST SURGERIES     Social History   Social History Narrative   Lives at home with  husband and family   Caffeine use: 1 cup coffee per week    There is no immunization history on file for this patient.   Objective: Vital Signs: There were no vitals taken for this visit.   Physical Exam   Musculoskeletal Exam: ***  CDAI Exam: CDAI Score: -- Patient Global: --; Provider Global: -- Swollen: --; Tender: -- Joint Exam 06/24/2020   No joint exam has been documented for this visit   There is currently no information documented on the homunculus. Go to the Rheumatology activity and complete the homunculus joint exam.  Investigation: No additional findings.  Imaging: No results found.  Recent Labs: Lab Results  Component Value Date   WBC 7.4 12/15/2019   HGB 14.4 12/15/2019   PLT 458 (H) 12/15/2019   NA 138 12/15/2019   K 4.4 12/15/2019   CL 104 12/15/2019   CO2 26 12/15/2019   GLUCOSE 79 12/15/2019   BUN 12 12/15/2019   CREATININE 0.64 12/15/2019   BILITOT 0.7 12/15/2019   ALKPHOS 91 04/13/2015   AST 16 12/15/2019   ALT 16 12/15/2019   PROT 7.0 12/15/2019   PROT 7.0 12/15/2019   ALBUMIN 4.3 04/13/2015   CALCIUM 9.2 12/15/2019   GFRAA 127 12/15/2019    Speciality Comments: No specialty comments available.  Procedures:  No procedures performed Allergies: Patient has no known allergies.   Assessment / Plan:     Visit Diagnoses: No diagnosis found.  Orders: No orders of  the defined types were placed in this encounter.  No orders of the defined types were placed in this encounter.   Face-to-face time spent with patient was *** minutes. Greater than 50% of time was spent in counseling and coordination of care.  Follow-Up Instructions: No follow-ups on file.   Earnestine Mealing, CMA  Note - This record has been created using Editor, commissioning.  Chart creation errors have been sought, but may not always  have been located. Such creation errors do not reflect on  the standard of medical care.

## 2020-06-23 NOTE — Telephone Encounter (Signed)
I received a fax from [facility] stating they need a signature from patient in order to process the appeal. I attempted to contact patient to discuss. Her VM is full so I will attempt to call tomorrow.

## 2020-06-24 ENCOUNTER — Ambulatory Visit: Payer: Medicaid Other | Admitting: Rheumatology

## 2020-06-24 ENCOUNTER — Telehealth: Payer: Self-pay | Admitting: Neurology

## 2020-06-24 NOTE — Telephone Encounter (Signed)
I attempted to contact patient again today. I was not able to reach her or LVM.

## 2020-06-24 NOTE — Telephone Encounter (Signed)
[facility] has accepted patient I have called and left patient a message that [facility] would be calling her to schedule .

## 2020-06-30 NOTE — Telephone Encounter (Signed)
I called patient and was able to speak with her. She states she can come to the office tomorrow to sign the form. I will place it up front.

## 2020-07-05 NOTE — Telephone Encounter (Signed)
Received fax from [facility]. They approved the request and the denial was overturned. Botox has been approved. I called [facility] and spoke with Arsenio Loader because I did not see approval dates on the [place]. PA #[id number]. Arsenio Loader states the PA is valid going forward unless patient's dosage changes or insurance terms. Reference #[id number].

## 2020-07-23 NOTE — Progress Notes (Deleted)
Office Visit Note  Patient: Kelly Marsh             Date of Birth: September 26, 1975           MRN: [id number]             PCP: Simona Huh, NP Referring: Simona Huh, NP Visit Date: 08/06/2020 Occupation: @GUAROCC @  Subjective:  No chief complaint on file.   History of Present Illness: Kelly Marsh is a 44 y.o. female ***   Activities of Daily Living:  Patient reports morning stiffness for *** {minute/hour:19697}.   Patient {ACTIONS;DENIES/REPORTS:21021675::"Denies"} nocturnal pain.  Difficulty dressing/grooming: {ACTIONS;DENIES/REPORTS:21021675::"Denies"} Difficulty climbing stairs: {ACTIONS;DENIES/REPORTS:21021675::"Denies"} Difficulty getting out of chair: {ACTIONS;DENIES/REPORTS:21021675::"Denies"} Difficulty using hands for taps, buttons, cutlery, and/or writing: {ACTIONS;DENIES/REPORTS:21021675::"Denies"}  No Rheumatology ROS completed.   PMFS History:  Patient Active Problem List   Diagnosis Date Noted  . Chronic migraine without aura, with intractable migraine, so stated, with status migrainosus 12/06/2017  . Chronic migraine without aura without status migrainosus, not intractable 10/26/2015  . Migraine with aura and with status migrainosus, not intractable 04/13/2015  . Blurry vision, bilateral 04/13/2015  . Perceived hearing changes 04/13/2015  . Daily headache 04/13/2015    Past Medical History:  Diagnosis Date  . Fatigue   . Migraine   . Snoring   . Vitamin B12 deficiency   . Vitamin D deficiency     Family History  Problem Relation Age of Onset  . Diabetes Mother   . Cancer Maternal Grandmother   . Cancer Maternal Grandfather   . Diabetes Sister   . Diabetes Brother   . Cancer Brother   . Healthy Daughter   . Healthy Son   . Healthy Son   . Healthy Son   . OCD Son   . Healthy Son   . Migraines Neg Hx    Past Surgical History:  Procedure Laterality Date  . NO PAST SURGERIES     Social History   Social History Narrative   Lives at home with  husband and family   Caffeine use: 1 cup coffee per week    There is no immunization history on file for this patient.   Objective: Vital Signs: There were no vitals taken for this visit.   Physical Exam   Musculoskeletal Exam: ***  CDAI Exam: CDAI Score: -- Patient Global: --; Provider Global: -- Swollen: --; Tender: -- Joint Exam 08/06/2020   No joint exam has been documented for this visit   There is currently no information documented on the homunculus. Go to the Rheumatology activity and complete the homunculus joint exam.  Investigation: No additional findings.  Imaging: No results found.  Recent Labs: Lab Results  Component Value Date   WBC 7.4 12/15/2019   HGB 14.4 12/15/2019   PLT 458 (H) 12/15/2019   NA 138 12/15/2019   K 4.4 12/15/2019   CL 104 12/15/2019   CO2 26 12/15/2019   GLUCOSE 79 12/15/2019   BUN 12 12/15/2019   CREATININE 0.64 12/15/2019   BILITOT 0.7 12/15/2019   ALKPHOS 91 04/13/2015   AST 16 12/15/2019   ALT 16 12/15/2019   PROT 7.0 12/15/2019   PROT 7.0 12/15/2019   ALBUMIN 4.3 04/13/2015   CALCIUM 9.2 12/15/2019   GFRAA 127 12/15/2019    Speciality Comments: No specialty comments available.  Procedures:  No procedures performed Allergies: Patient has no known allergies.   Assessment / Plan:     Visit Diagnoses: No diagnosis found.  Orders: No orders of  the defined types were placed in this encounter.  No orders of the defined types were placed in this encounter.   Face-to-face time spent with patient was *** minutes. Greater than 50% of time was spent in counseling and coordination of care.  Follow-Up Instructions: No follow-ups on file.   Earnestine Mealing, CMA  Note - This record has been created using Editor, commissioning.  Chart creation errors have been sought, but may not always  have been located. Such creation errors do not reflect on  the standard of medical care.

## 2020-07-28 ENCOUNTER — Ambulatory Visit: Payer: Medicaid Other | Admitting: Rheumatology

## 2020-08-06 ENCOUNTER — Ambulatory Visit: Payer: Medicaid Other | Admitting: Rheumatology

## 2020-08-06 DIAGNOSIS — R5383 Other fatigue: Secondary | ICD-10-CM

## 2020-08-06 DIAGNOSIS — D75839 Thrombocytosis, unspecified: Secondary | ICD-10-CM

## 2020-08-06 DIAGNOSIS — M3501 Sicca syndrome with keratoconjunctivitis: Secondary | ICD-10-CM

## 2020-08-06 DIAGNOSIS — M542 Cervicalgia: Secondary | ICD-10-CM

## 2020-08-06 DIAGNOSIS — E781 Pure hyperglyceridemia: Secondary | ICD-10-CM

## 2020-08-06 DIAGNOSIS — E538 Deficiency of other specified B group vitamins: Secondary | ICD-10-CM

## 2020-08-06 DIAGNOSIS — G43101 Migraine with aura, not intractable, with status migrainosus: Secondary | ICD-10-CM

## 2020-08-06 DIAGNOSIS — M170 Bilateral primary osteoarthritis of knee: Secondary | ICD-10-CM

## 2020-08-06 DIAGNOSIS — E559 Vitamin D deficiency, unspecified: Secondary | ICD-10-CM

## 2020-08-06 DIAGNOSIS — M19072 Primary osteoarthritis, left ankle and foot: Secondary | ICD-10-CM

## 2020-08-06 DIAGNOSIS — K760 Fatty (change of) liver, not elsewhere classified: Secondary | ICD-10-CM

## 2020-08-19 ENCOUNTER — Ambulatory Visit: Payer: Self-pay | Admitting: Family Medicine

## 2020-08-23 NOTE — Progress Notes (Deleted)
Office Visit Note  Patient: Kelly Marsh             Date of Birth: August 06, 1976           MRN: [id number]             PCP: Simona Huh, NP Referring: Simona Huh, NP Visit Date: 09/03/2020 Occupation: @GUAROCC @  Subjective:  No chief complaint on file.   History of Present Illness: Kelly Marsh is a 44 y.o. female ***   Activities of Daily Living:  Patient reports morning stiffness for *** {minute/hour:19697}.   Patient {ACTIONS;DENIES/REPORTS:21021675::"Denies"} nocturnal pain.  Difficulty dressing/grooming: {ACTIONS;DENIES/REPORTS:21021675::"Denies"} Difficulty climbing stairs: {ACTIONS;DENIES/REPORTS:21021675::"Denies"} Difficulty getting out of chair: {ACTIONS;DENIES/REPORTS:21021675::"Denies"} Difficulty using hands for taps, buttons, cutlery, and/or writing: {ACTIONS;DENIES/REPORTS:21021675::"Denies"}  No Rheumatology ROS completed.   PMFS History:  Patient Active Problem List   Diagnosis Date Noted  . Chronic migraine without aura, with intractable migraine, so stated, with status migrainosus 12/06/2017  . Chronic migraine without aura without status migrainosus, not intractable 10/26/2015  . Migraine with aura and with status migrainosus, not intractable 04/13/2015  . Blurry vision, bilateral 04/13/2015  . Perceived hearing changes 04/13/2015  . Daily headache 04/13/2015    Past Medical History:  Diagnosis Date  . Fatigue   . Migraine   . Snoring   . Vitamin B12 deficiency   . Vitamin D deficiency     Family History  Problem Relation Age of Onset  . Diabetes Mother   . Cancer Maternal Grandmother   . Cancer Maternal Grandfather   . Diabetes Sister   . Diabetes Brother   . Cancer Brother   . Healthy Daughter   . Healthy Son   . Healthy Son   . Healthy Son   . OCD Son   . Healthy Son   . Migraines Neg Hx    Past Surgical History:  Procedure Laterality Date  . NO PAST SURGERIES     Social History   Social History Narrative   Lives at home with  husband and family   Caffeine use: 1 cup coffee per week    There is no immunization history on file for this patient.   Objective: Vital Signs: There were no vitals taken for this visit.   Physical Exam   Musculoskeletal Exam: ***  CDAI Exam: CDAI Score: -- Patient Global: --; Provider Global: -- Swollen: --; Tender: -- Joint Exam 09/03/2020   No joint exam has been documented for this visit   There is currently no information documented on the homunculus. Go to the Rheumatology activity and complete the homunculus joint exam.  Investigation: No additional findings.  Imaging: No results found.  Recent Labs: Lab Results  Component Value Date   WBC 7.4 12/15/2019   HGB 14.4 12/15/2019   PLT 458 (H) 12/15/2019   NA 138 12/15/2019   K 4.4 12/15/2019   CL 104 12/15/2019   CO2 26 12/15/2019   GLUCOSE 79 12/15/2019   BUN 12 12/15/2019   CREATININE 0.64 12/15/2019   BILITOT 0.7 12/15/2019   ALKPHOS 91 04/13/2015   AST 16 12/15/2019   ALT 16 12/15/2019   PROT 7.0 12/15/2019   PROT 7.0 12/15/2019   ALBUMIN 4.3 04/13/2015   CALCIUM 9.2 12/15/2019   GFRAA 127 12/15/2019    Speciality Comments: No specialty comments available.  Procedures:  No procedures performed Allergies: Patient has no known allergies.   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  No orders of the defined types were placed in this encounter.   Face-to-face time spent with patient was *** minutes. Greater than 50% of time was spent in counseling and coordination of care.  Follow-Up Instructions:  No follow-ups on file.   Ofilia Neas, PA-C  Note - This record has been created using Dragon software.  Chart creation errors have been sought, but may not always  have been located. Such creation errors do not reflect on  the standard of medical care.

## 2020-08-30 ENCOUNTER — Ambulatory Visit: Payer: Medicaid Other | Admitting: Family Medicine

## 2020-08-30 DIAGNOSIS — G43709 Chronic migraine without aura, not intractable, without status migrainosus: Secondary | ICD-10-CM | POA: Diagnosis not present

## 2020-08-30 NOTE — Progress Notes (Addendum)
Consent Form Botulism Toxin Injection For Chronic Migraine  Interval history: 08/30/2020: She was referred to [facility] for intractable migraines, not relieved with abortive therapies. She has about 4-5 migraines per month. She does not use abortive therapies as they are ineffective. Botox seems to be helping. She is trying to avoid triggers.   Interval history 05/13/2020: has a tooth infection and getting more migraines since then but still she has done exceptionally well with this treatment. >50% reduction in frequency of migraines and headaches since starting botox. Patient has tried multiple acute meds.Clenching is a problem. She feels as though she still has severe headaches, nothing we have given her has helped with acute management and the migraines she still gets are severe we will refer to [place].   [address]: [place]  Reviewed orally with patient, additionally signature is on file:  Botulism toxin has been approved by the Federal drug administration for treatment of chronic migraine. Botulism toxin does not cure chronic migraine and it may not be effective in some patients.  The administration of botulism toxin is accomplished by injecting a small amount of toxin into the muscles of the neck and head. Dosage must be titrated for each individual. Any benefits resulting from botulism toxin tend to wear off after 3 months with a repeat injection required if benefit is to be maintained. Injections are usually done every 3-4 months with maximum effect peak achieved by about 2 or 3 weeks. Botulism toxin is expensive and you should be sure of what costs you will incur resulting from the injection.  The side effects of botulism toxin use for chronic migraine may include:   -Transient, and usually mild, facial weakness with facial injections  -Transient, and usually mild, head or neck weakness with head/neck injections  -Reduction or loss of forehead facial animation due to  forehead muscle weakness  -Eyelid drooping  -Dry eye  -Pain at the site of injection or bruising at the site of injection  -Double vision  -Potential unknown long term risks  Contraindications: You should not have Botox if you are pregnant, nursing, allergic to albumin, have an infection, skin condition, or muscle weakness at the site of the injection, or have myasthenia gravis, Lambert-Eaton syndrome, or ALS.  It is also possible that as with any injection, there may be an allergic reaction or no effect from the medication. Reduced effectiveness after repeated injections is sometimes seen and rarely infection at the injection site may occur. All care will be taken to prevent these side effects. If therapy is given over a long time, atrophy and wasting in the muscle injected may occur. Occasionally the patient's become refractory to treatment because they develop antibodies to the toxin. In this event, therapy needs to be modified.  I have read the above information and consent to the administration of botulism toxin.    BOTOX PROCEDURE NOTE FOR MIGRAINE HEADACHE    Contraindications and precautions discussed with patient(above). Aseptic procedure was observed and patient tolerated procedure. Procedure performed by Debbora Presto, FNP-C.   The condition has existed for more than 6 months, and pt does not have a diagnosis of ALS, Myasthenia Gravis or Lambert-Eaton Syndrome.  Risks and benefits of injections discussed and pt agrees to proceed with the procedure.  Written consent obtained  These injections are medically necessary. Pt  receives good benefits from these injections. These injections do not cause sedations or hallucinations which the oral therapies may cause.  Description of procedure:  The patient was placed in a sitting position. The standard protocol was used for Botox as follows, with 5 units of Botox injected at each site:   -Procerus muscle, midline injection  -Corrugator  muscle, bilateral injection  -Frontalis muscle, bilateral injection, with 2 sites each side, medial injection was performed in the upper one third of the frontalis muscle, in the region vertical from the medial inferior edge of the superior orbital rim. The lateral injection was again in the upper one third of the forehead vertically above the lateral limbus of the cornea, 1.5 cm lateral to the medial injection site.  - Levator Scapulae: 5 units bilaterally  -Temporalis muscle injection, 5 sites, bilaterally. The first injection was 3 cm above the tragus of the ear, second injection site was 1.5 cm to 3 cm up from the first injection site in line with the tragus of the ear. The third injection site was 1.5-3 cm forward between the first 2 injection sites. The fourth injection site was 1.5 cm posterior to the second injection site. 5th site laterally in the temporalis  muscleat the level of the outer canthus.  - Patient feels her clenching is a trigger for headaches. +5 units masseter bilaterally   -Occipitalis muscle injection, 3 sites, bilaterally. The first injection was done one half way between the occipital protuberance and the tip of the mastoid process behind the ear. The second injection site was done lateral and superior to the first, 1 fingerbreadth from the first injection. The third injection site was 1 fingerbreadth superiorly and medially from the first injection site.  -Cervical paraspinal muscle injection, 2 sites, bilateral knee first injection site was 1 cm from the midline of the cervical spine, 3 cm inferior to the lower border of the occipital protuberance. The second injection site was 1.5 cm superiorly and laterally to the first injection site.  -Trapezius muscle injection was performed at 3 sites, bilaterally. The first injection site was in the upper trapezius muscle halfway between the inflection point of the neck, and the acromion. The second injection site was one half way  between the acromion and the first injection site. The third injection was done between the first injection site and the inflection point of the neck.   Will return for repeat injection in 3 months.   A 200 unit sof Botox was used, any Botox not injected was wasted. The patient tolerated the procedure well, there were no complications of the above procedure.  Made any corrections needed, and agree with procedure   Sarina Ill, MD [facility]

## 2020-08-30 NOTE — Progress Notes (Signed)
Botox-100unitsx2 vials Lot: [id number] Expiration: 10/2022 NDC: 8350-7573-22   0.9% Sodium Chloride- 64mL total Lot: [id number] Expiration: 06/2022 NDC: 98022-179-81  Dx: Chronic Migraines B/B   Consent signed

## 2020-09-03 ENCOUNTER — Ambulatory Visit: Payer: Medicaid Other | Admitting: Physician Assistant

## 2020-09-03 DIAGNOSIS — M170 Bilateral primary osteoarthritis of knee: Secondary | ICD-10-CM

## 2020-09-03 DIAGNOSIS — D75839 Thrombocytosis, unspecified: Secondary | ICD-10-CM

## 2020-09-03 DIAGNOSIS — E781 Pure hyperglyceridemia: Secondary | ICD-10-CM

## 2020-09-03 DIAGNOSIS — E538 Deficiency of other specified B group vitamins: Secondary | ICD-10-CM

## 2020-09-03 DIAGNOSIS — K760 Fatty (change of) liver, not elsewhere classified: Secondary | ICD-10-CM

## 2020-09-03 DIAGNOSIS — E559 Vitamin D deficiency, unspecified: Secondary | ICD-10-CM

## 2020-09-03 DIAGNOSIS — M19072 Primary osteoarthritis, left ankle and foot: Secondary | ICD-10-CM

## 2020-09-03 DIAGNOSIS — R5383 Other fatigue: Secondary | ICD-10-CM

## 2020-09-03 DIAGNOSIS — G43101 Migraine with aura, not intractable, with status migrainosus: Secondary | ICD-10-CM

## 2020-09-03 DIAGNOSIS — M3501 Sicca syndrome with keratoconjunctivitis: Secondary | ICD-10-CM

## 2020-09-21 NOTE — Progress Notes (Deleted)
Office Visit Note  Patient: Kelly Marsh             Date of Birth: 1975/09/26           MRN: [id number]             PCP: Courtney Paris, NP Referring: Courtney Paris, NP Visit Date: 10/05/2020 Occupation: @GUAROCC @  Subjective:  No chief complaint on file.   History of Present Illness: Kelly Marsh is a 45 y.o. female ***   Activities of Daily Living:  Patient reports morning stiffness for *** {minute/hour:19697}.   Patient {ACTIONS;DENIES/REPORTS:21021675::"Denies"} nocturnal pain.  Difficulty dressing/grooming: {ACTIONS;DENIES/REPORTS:21021675::"Denies"} Difficulty climbing stairs: {ACTIONS;DENIES/REPORTS:21021675::"Denies"} Difficulty getting out of chair: {ACTIONS;DENIES/REPORTS:21021675::"Denies"} Difficulty using hands for taps, buttons, cutlery, and/or writing: {ACTIONS;DENIES/REPORTS:21021675::"Denies"}  No Rheumatology ROS completed.   PMFS History:  Patient Active Problem List   Diagnosis Date Noted  . Chronic migraine without aura, with intractable migraine, so stated, with status migrainosus 12/06/2017  . Chronic migraine without aura without status migrainosus, not intractable 10/26/2015  . Migraine with aura and with status migrainosus, not intractable 04/13/2015  . Blurry vision, bilateral 04/13/2015  . Perceived hearing changes 04/13/2015  . Daily headache 04/13/2015    Past Medical History:  Diagnosis Date  . Fatigue   . Migraine   . Snoring   . Vitamin B12 deficiency   . Vitamin D deficiency     Family History  Problem Relation Age of Onset  . Diabetes Mother   . Cancer Maternal Grandmother   . Cancer Maternal Grandfather   . Diabetes Sister   . Diabetes Brother   . Cancer Brother   . Healthy Daughter   . Healthy Son   . Healthy Son   . Healthy Son   . OCD Son   . Healthy Son   . Migraines Neg Hx    Past Surgical History:  Procedure Laterality Date  . NO PAST SURGERIES     Social History   Social History Narrative   Lives at home with  husband and family   Caffeine use: 1 cup coffee per week    There is no immunization history on file for this patient.   Objective: Vital Signs: There were no vitals taken for this visit.   Physical Exam   Musculoskeletal Exam: ***  CDAI Exam: CDAI Score: -- Patient Global: --; Provider Global: -- Swollen: --; Tender: -- Joint Exam 10/05/2020   No joint exam has been documented for this visit   There is currently no information documented on the homunculus. Go to the Rheumatology activity and complete the homunculus joint exam.  Investigation: No additional findings.  Imaging: No results found.  Recent Labs: Lab Results  Component Value Date   WBC 7.4 12/15/2019   HGB 14.4 12/15/2019   PLT 458 (H) 12/15/2019   NA 138 12/15/2019   K 4.4 12/15/2019   CL 104 12/15/2019   CO2 26 12/15/2019   GLUCOSE 79 12/15/2019   BUN 12 12/15/2019   CREATININE 0.64 12/15/2019   BILITOT 0.7 12/15/2019   ALKPHOS 91 04/13/2015   AST 16 12/15/2019   ALT 16 12/15/2019   PROT 7.0 12/15/2019   PROT 7.0 12/15/2019   ALBUMIN 4.3 04/13/2015   CALCIUM 9.2 12/15/2019   GFRAA 127 12/15/2019    Speciality Comments: No specialty comments available.  Procedures:  No procedures performed Allergies: Patient has no known allergies.   Assessment / Plan:     Visit Diagnoses: No diagnosis found.  Orders: No orders of  the defined types were placed in this encounter.  No orders of the defined types were placed in this encounter.   Face-to-face time spent with patient was *** minutes. Greater than 50% of time was spent in counseling and coordination of care.  Follow-Up Instructions: No follow-ups on file.   Earnestine Mealing, CMA  Note - This record has been created using Editor, commissioning.  Chart creation errors have been sought, but may not always  have been located. Such creation errors do not reflect on  the standard of medical care.

## 2020-10-05 ENCOUNTER — Ambulatory Visit: Payer: Medicaid Other | Admitting: Physician Assistant

## 2020-10-05 DIAGNOSIS — E781 Pure hyperglyceridemia: Secondary | ICD-10-CM

## 2020-10-05 DIAGNOSIS — R5383 Other fatigue: Secondary | ICD-10-CM

## 2020-10-05 DIAGNOSIS — K760 Fatty (change of) liver, not elsewhere classified: Secondary | ICD-10-CM

## 2020-10-05 DIAGNOSIS — M3501 Sicca syndrome with keratoconjunctivitis: Secondary | ICD-10-CM

## 2020-10-05 DIAGNOSIS — E538 Deficiency of other specified B group vitamins: Secondary | ICD-10-CM

## 2020-10-05 DIAGNOSIS — M19071 Primary osteoarthritis, right ankle and foot: Secondary | ICD-10-CM

## 2020-10-05 DIAGNOSIS — M170 Bilateral primary osteoarthritis of knee: Secondary | ICD-10-CM

## 2020-10-05 DIAGNOSIS — E559 Vitamin D deficiency, unspecified: Secondary | ICD-10-CM

## 2020-10-05 DIAGNOSIS — D75839 Thrombocytosis, unspecified: Secondary | ICD-10-CM

## 2020-10-05 DIAGNOSIS — M542 Cervicalgia: Secondary | ICD-10-CM

## 2020-10-05 DIAGNOSIS — G43101 Migraine with aura, not intractable, with status migrainosus: Secondary | ICD-10-CM

## 2020-12-01 ENCOUNTER — Telehealth: Payer: Self-pay | Admitting: Neurology

## 2020-12-01 ENCOUNTER — Ambulatory Visit: Payer: Medicaid Other | Admitting: Family Medicine

## 2020-12-01 DIAGNOSIS — G43709 Chronic migraine without aura, not intractable, without status migrainosus: Secondary | ICD-10-CM

## 2020-12-01 MED ORDER — ELETRIPTAN HYDROBROMIDE 40 MG PO TABS: 40.0000 mg | ORAL_TABLET | ORAL | 0 refills | Status: DC | PRN

## 2020-12-01 NOTE — Progress Notes (Signed)
Botox- 100 units x 2 vial Lot: [id number] Expiration: 01/2023 NDC: 9292-4462-86   Bacteriostatic 0.9% Sodium Chloride- 51mL total [id number] Expiration: 10/2021 NDC: 90383-338-32   Dx: N19.166 BB

## 2020-12-01 NOTE — Progress Notes (Signed)
Consent Form Botulism Toxin Injection For Chronic Migraine  12/01/2020 ALL: She continues Botox therapy. She feels it helps some but continues to have 4-8 migraines per month. She has tried and failed multiple prevention and abortive meds. She is not taking anything for abortive therapy. Previously tried sumatriptan, rizatriptan, frovatriptan, naproxen, toradol, Ubrelvy. She has not heard back from [facility] [place] and was encouraged to call to schedule appt. I will call in eletriptan to see if this helps. Appropriate administration discussed with patient.   08/30/2020 ALL: She was referred to [facility] for intractable migraines, not relieved with abortive therapies. She has about 4-5 migraines per month. She does not use abortive therapies as they are ineffective. Botox seems to be helping. She is trying to avoid triggers.   Interval history 05/13/2020 AA: has a tooth infection and getting more migraines since then but still she has done exceptionally well with this treatment. >50% reduction in frequency of migraines and headaches since starting botox. Patient has tried multiple acute meds.Clenching is a problem. She feels as though she still has severe headaches, nothing we have given her has helped with acute management and the migraines she still gets are severe we will refer to [place].   [address]: [place]  Reviewed orally with patient, additionally signature is on file:  Botulism toxin has been approved by the Federal drug administration for treatment of chronic migraine. Botulism toxin does not cure chronic migraine and it may not be effective in some patients.  The administration of botulism toxin is accomplished by injecting a small amount of toxin into the muscles of the neck and head. Dosage must be titrated for each individual. Any benefits resulting from botulism toxin tend to wear off after 3 months with a repeat injection required if benefit is to be maintained. Injections are  usually done every 3-4 months with maximum effect peak achieved by about 2 or 3 weeks. Botulism toxin is expensive and you should be sure of what costs you will incur resulting from the injection.  The side effects of botulism toxin use for chronic migraine may include:   -Transient, and usually mild, facial weakness with facial injections  -Transient, and usually mild, head or neck weakness with head/neck injections  -Reduction or loss of forehead facial animation due to forehead muscle weakness  -Eyelid drooping  -Dry eye  -Pain at the site of injection or bruising at the site of injection  -Double vision  -Potential unknown long term risks  Contraindications: You should not have Botox if you are pregnant, nursing, allergic to albumin, have an infection, skin condition, or muscle weakness at the site of the injection, or have myasthenia gravis, Lambert-Eaton syndrome, or ALS.  It is also possible that as with any injection, there may be an allergic reaction or no effect from the medication. Reduced effectiveness after repeated injections is sometimes seen and rarely infection at the injection site may occur. All care will be taken to prevent these side effects. If therapy is given over a long time, atrophy and wasting in the muscle injected may occur. Occasionally the patient's become refractory to treatment because they develop antibodies to the toxin. In this event, therapy needs to be modified.  I have read the above information and consent to the administration of botulism toxin.    BOTOX PROCEDURE NOTE FOR MIGRAINE HEADACHE    Contraindications and precautions discussed with patient(above). Aseptic procedure was observed and patient tolerated procedure. Procedure performed by Debbora Presto, FNP-C.  The condition has existed for more than 6 months, and pt does not have a diagnosis of ALS, Myasthenia Gravis or Lambert-Eaton Syndrome.  Risks and benefits of injections discussed and pt  agrees to proceed with the procedure.  Written consent obtained  These injections are medically necessary. Pt  receives good benefits from these injections. These injections do not cause sedations or hallucinations which the oral therapies may cause.  Description of procedure:  The patient was placed in a sitting position. The standard protocol was used for Botox as follows, with 5 units of Botox injected at each site:   -Procerus muscle, midline injection  -Corrugator muscle, bilateral injection  -Frontalis muscle, bilateral injection, with 2 sites each side, medial injection was performed in the upper one third of the frontalis muscle, in the region vertical from the medial inferior edge of the superior orbital rim. The lateral injection was again in the upper one third of the forehead vertically above the lateral limbus of the cornea, 1.5 cm lateral to the medial injection site.  -Temporalis muscle injection, 5 sites, bilaterally. The first injection was 3 cm above the tragus of the ear, second injection site was 1.5 cm to 3 cm up from the first injection site in line with the tragus of the ear. The third injection site was 1.5-3 cm forward between the first 2 injection sites. The fourth injection site was 1.5 cm posterior to the second injection site. 5th site laterally in the temporalis  muscleat the level of the outer canthus.  -Occipitalis muscle injection, 3 sites, bilaterally. The first injection was done one half way between the occipital protuberance and the tip of the mastoid process behind the ear. The second injection site was done lateral and superior to the first, 1 fingerbreadth from the first injection. The third injection site was 1 fingerbreadth superiorly and medially from the first injection site.  -Cervical paraspinal muscle injection, 2 sites, bilateral knee first injection site was 1 cm from the midline of the cervical spine, 3 cm inferior to the lower border of the  occipital protuberance. The second injection site was 1.5 cm superiorly and laterally to the first injection site.  -Trapezius muscle injection was performed at 3 sites, bilaterally. The first injection site was in the upper trapezius muscle halfway between the inflection point of the neck, and the acromion. The second injection site was one half way between the acromion and the first injection site. The third injection was done between the first injection site and the inflection point of the neck.   Will return for repeat injection in 3 months.   A 200 unit sof Botox was used, any Botox not injected was wasted. The patient tolerated the procedure well, there were no complications of the above procedure.  Made any corrections needed, and agree with procedure   Sarina Ill, MD [facility]

## 2020-12-01 NOTE — Telephone Encounter (Signed)
PA submitted through [employer]. [id number] Will await decision

## 2020-12-02 NOTE — Telephone Encounter (Signed)
PA Approved. This drug has been approved. Approved quantity: 10 <> per 30 day(s). You may fill up to a 34 day supply at a retail pharmacy. For 1 year

## 2021-03-03 ENCOUNTER — Ambulatory Visit: Payer: Medicaid Other | Admitting: Family Medicine

## 2021-04-04 ENCOUNTER — Ambulatory Visit: Payer: Medicaid Other | Admitting: Family Medicine

## 2021-04-04 ENCOUNTER — Encounter: Payer: Self-pay | Admitting: Family Medicine

## 2021-04-04 ENCOUNTER — Other Ambulatory Visit: Payer: Self-pay | Admitting: Family Medicine

## 2021-04-04 DIAGNOSIS — G43709 Chronic migraine without aura, not intractable, without status migrainosus: Secondary | ICD-10-CM | POA: Diagnosis not present

## 2021-04-04 NOTE — Progress Notes (Signed)
Botox- 100 units x 2 vials Lot: [id number] Expiration: 02/2023 NDC: 4144-3601-65  Bacteriostatic 0.9% Sodium Chloride- 49mL total Lot: [id number] Expiration: 09/18/2022 NDC: 9494-4739-58  Dx: G41.712 B/B

## 2021-04-04 NOTE — Progress Notes (Addendum)
Consent Form Botulism Toxin Injection For Chronic Migraine  04/04/2021 ALL: She is doing well, today. She continues to have about 4-8 migraine days each month. Eletriptan was effective for abortive therapy. She has decided to continue Botox and does not wish to pursue [facility] referral at this time.   12/01/2020 ALL: She continues Botox therapy. She feels it helps some but continues to have 4-8 migraines per month. She has tried and failed multiple prevention and abortive meds. She is not taking anything for abortive therapy. Previously tried sumatriptan, rizatriptan, frovatriptan, naproxen, toradol, Ubrelvy. She has not heard back from [facility] and was encouraged to call to schedule appt. I will call in eletriptan to see if this helps. Appropriate administration discussed with patient.   08/30/2020 ALL: She was referred to [facility] for intractable migraines, not relieved with abortive therapies. She has about 4-5 migraines per month. She does not use abortive therapies as they are ineffective. Botox seems to be helping. She is trying to avoid triggers.   Interval history 05/13/2020 AA: has a tooth infection and getting more migraines since then but still she has done exceptionally well with this treatment. >50% reduction in frequency of migraines and headaches since starting botox. Patient has tried multiple acute meds.Clenching is a problem. She feels as though she still has severe headaches, nothing we have given her has helped with acute management and the migraines she still gets are severe we will refer to [place].   [address]: [place]  Reviewed orally with patient, additionally signature is on file:  Botulism toxin has been approved by the Federal drug administration for treatment of chronic migraine. Botulism toxin does not cure chronic migraine and it may not be effective in some patients.  The administration of botulism toxin is accomplished by injecting a small amount of toxin  into the muscles of the neck and head. Dosage must be titrated for each individual. Any benefits resulting from botulism toxin tend to wear off after 3 months with a repeat injection required if benefit is to be maintained. Injections are usually done every 3-4 months with maximum effect peak achieved by about 2 or 3 weeks. Botulism toxin is expensive and you should be sure of what costs you will incur resulting from the injection.  The side effects of botulism toxin use for chronic migraine may include:   -Transient, and usually mild, facial weakness with facial injections  -Transient, and usually mild, head or neck weakness with head/neck injections  -Reduction or loss of forehead facial animation due to forehead muscle weakness  -Eyelid drooping  -Dry eye  -Pain at the site of injection or bruising at the site of injection  -Double vision  -Potential unknown long term risks  Contraindications: You should not have Botox if you are pregnant, nursing, allergic to albumin, have an infection, skin condition, or muscle weakness at the site of the injection, or have myasthenia gravis, Lambert-Eaton syndrome, or ALS.  It is also possible that as with any injection, there may be an allergic reaction or no effect from the medication. Reduced effectiveness after repeated injections is sometimes seen and rarely infection at the injection site may occur. All care will be taken to prevent these side effects. If therapy is given over a long time, atrophy and wasting in the muscle injected may occur. Occasionally the patient's become refractory to treatment because they develop antibodies to the toxin. In this event, therapy needs to be modified.  I have read the  above information and consent to the administration of botulism toxin.    BOTOX PROCEDURE NOTE FOR MIGRAINE HEADACHE    Contraindications and precautions discussed with patient(above). Aseptic procedure was observed and patient tolerated  procedure. Procedure performed by Debbora Presto, FNP-C.   The condition has existed for more than 6 months, and pt does not have a diagnosis of ALS, Myasthenia Gravis or Lambert-Eaton Syndrome.  Risks and benefits of injections discussed and pt agrees to proceed with the procedure.  Written consent obtained  These injections are medically necessary. Pt  receives good benefits from these injections. These injections do not cause sedations or hallucinations which the oral therapies may cause.  Description of procedure:  The patient was placed in a sitting position. The standard protocol was used for Botox as follows, with 5 units of Botox injected at each site:   -Procerus muscle, midline injection  -Corrugator muscle, bilateral injection  -Frontalis muscle, bilateral injection, with 2 sites each side, medial injection was performed in the upper one third of the frontalis muscle, in the region vertical from the medial inferior edge of the superior orbital rim. The lateral injection was again in the upper one third of the forehead vertically above the lateral limbus of the cornea, 1.5 cm lateral to the medial injection site.  -Temporalis muscle injection, 5 sites, bilaterally. The first injection was 3 cm above the tragus of the ear, second injection site was 1.5 cm to 3 cm up from the first injection site in line with the tragus of the ear. The third injection site was 1.5-3 cm forward between the first 2 injection sites. The fourth injection site was 1.5 cm posterior to the second injection site. 5th site laterally in the temporalis  muscleat the level of the outer canthus.  -Occipitalis muscle injection, 3 sites, bilaterally. The first injection was done one half way between the occipital protuberance and the tip of the mastoid process behind the ear. The second injection site was done lateral and superior to the first, 1 fingerbreadth from the first injection. The third injection site was 1  fingerbreadth superiorly and medially from the first injection site.  -Cervical paraspinal muscle injection, 2 sites, bilateral knee first injection site was 1 cm from the midline of the cervical spine, 3 cm inferior to the lower border of the occipital protuberance. The second injection site was 1.5 cm superiorly and laterally to the first injection site.  -Trapezius muscle injection was performed at 3 sites, bilaterally. The first injection site was in the upper trapezius muscle halfway between the inflection point of the neck, and the acromion. The second injection site was one half way between the acromion and the first injection site. The third injection was done between the first injection site and the inflection point of the neck.   Will return for repeat injection in 3 months.   A 200 unit sof Botox was used, any Botox not injected was wasted. The patient tolerated the procedure well, there were no complications of the above procedure.   Agree with procedure   Sarina Ill, MD [facility]

## 2021-04-05 MED ORDER — ELETRIPTAN HYDROBROMIDE 40 MG PO TABS: 40.0000 mg | ORAL_TABLET | ORAL | 0 refills | Status: AC | PRN

## 2021-07-05 NOTE — Progress Notes (Signed)
Consent Form Botulism Toxin Injection For Chronic Migraine  07/06/2021 ALL: She continues to do well on Botox. 4-5 migraine days per month. Eletriptan works well. She has a migraine today but was scared to take abortive meds. She will take when she gets home.   04/04/2021 ALL: She is doing well, today. She continues to have about 4-8 migraine days each month. Eletriptan was effective for abortive therapy. She has decided to continue Botox and does not wish to pursue [facility] referral at this time.   12/01/2020 ALL: She continues Botox therapy. She feels it helps some but continues to have 4-8 migraines per month. She has tried and failed multiple prevention and abortive meds. She is not taking anything for abortive therapy. Previously tried sumatriptan, rizatriptan, frovatriptan, naproxen, toradol, Ubrelvy. She has not heard back from [facility] and was encouraged to call to schedule appt. I will call in eletriptan to see if this helps. Appropriate administration discussed with patient.   08/30/2020 ALL: She was referred to [facility] for intractable migraines, not relieved with abortive therapies. She has about 4-5 migraines per month. She does not use abortive therapies as they are ineffective. Botox seems to be helping. She is trying to avoid triggers.   Interval history 05/13/2020 AA: has a tooth infection and getting more migraines since then but still she has done exceptionally well with this treatment. >50% reduction in frequency of migraines and headaches since starting botox. Patient has tried multiple acute meds.Clenching is a problem. She feels as though she still has severe headaches, nothing we have given her has helped with acute management and the migraines she still gets are severe we will refer to [place].   [address]: [place]  Reviewed orally with patient, additionally signature is on file:  Botulism toxin has been approved by the Federal drug administration for treatment of  chronic migraine. Botulism toxin does not cure chronic migraine and it may not be effective in some patients.  The administration of botulism toxin is accomplished by injecting a small amount of toxin into the muscles of the neck and head. Dosage must be titrated for each individual. Any benefits resulting from botulism toxin tend to wear off after 3 months with a repeat injection required if benefit is to be maintained. Injections are usually done every 3-4 months with maximum effect peak achieved by about 2 or 3 weeks. Botulism toxin is expensive and you should be sure of what costs you will incur resulting from the injection.  The side effects of botulism toxin use for chronic migraine may include:   -Transient, and usually mild, facial weakness with facial injections  -Transient, and usually mild, head or neck weakness with head/neck injections  -Reduction or loss of forehead facial animation due to forehead muscle weakness  -Eyelid drooping  -Dry eye  -Pain at the site of injection or bruising at the site of injection  -Double vision  -Potential unknown long term risks  Contraindications: You should not have Botox if you are pregnant, nursing, allergic to albumin, have an infection, skin condition, or muscle weakness at the site of the injection, or have myasthenia gravis, Lambert-Eaton syndrome, or ALS.  It is also possible that as with any injection, there may be an allergic reaction or no effect from the medication. Reduced effectiveness after repeated injections is sometimes seen and rarely infection at the injection site may occur. All care will be taken to prevent these side effects. If therapy is given over a  long time, atrophy and wasting in the muscle injected may occur. Occasionally the patient's become refractory to treatment because they develop antibodies to the toxin. In this event, therapy needs to be modified.  I have read the above information and consent to the  administration of botulism toxin.    BOTOX PROCEDURE NOTE FOR MIGRAINE HEADACHE    Contraindications and precautions discussed with patient(above). Aseptic procedure was observed and patient tolerated procedure. Procedure performed by Debbora Presto, FNP-C.   The condition has existed for more than 6 months, and pt does not have a diagnosis of ALS, Myasthenia Gravis or Lambert-Eaton Syndrome.  Risks and benefits of injections discussed and pt agrees to proceed with the procedure.  Written consent obtained  These injections are medically necessary. Pt  receives good benefits from these injections. These injections do not cause sedations or hallucinations which the oral therapies may cause.  Description of procedure:  The patient was placed in a sitting position. The standard protocol was used for Botox as follows, with 5 units of Botox injected at each site:   -Procerus muscle, midline injection  -Corrugator muscle, bilateral injection  -Frontalis muscle, bilateral injection, with 2 sites each side, medial injection was performed in the upper one third of the frontalis muscle, in the region vertical from the medial inferior edge of the superior orbital rim. The lateral injection was again in the upper one third of the forehead vertically above the lateral limbus of the cornea, 1.5 cm lateral to the medial injection site.  -Temporalis muscle injection, 5 sites, bilaterally. The first injection was 3 cm above the tragus of the ear, second injection site was 1.5 cm to 3 cm up from the first injection site in line with the tragus of the ear. The third injection site was 1.5-3 cm forward between the first 2 injection sites. The fourth injection site was 1.5 cm posterior to the second injection site. 5th site laterally in the temporalis  muscleat the level of the outer canthus.  -Occipitalis muscle injection, 3 sites, bilaterally. The first injection was done one half way between the occipital  protuberance and the tip of the mastoid process behind the ear. The second injection site was done lateral and superior to the first, 1 fingerbreadth from the first injection. The third injection site was 1 fingerbreadth superiorly and medially from the first injection site.  -Cervical paraspinal muscle injection, 2 sites, bilateral knee first injection site was 1 cm from the midline of the cervical spine, 3 cm inferior to the lower border of the occipital protuberance. The second injection site was 1.5 cm superiorly and laterally to the first injection site.  -Trapezius muscle injection was performed at 3 sites, bilaterally. The first injection site was in the upper trapezius muscle halfway between the inflection point of the neck, and the acromion. The second injection site was one half way between the acromion and the first injection site. The third injection was done between the first injection site and the inflection point of the neck.   Will return for repeat injection in 3 months.   A 200 unit sof Botox was used, any Botox not injected was wasted. The patient tolerated the procedure well, there were no complications of the above procedure.

## 2021-07-06 ENCOUNTER — Ambulatory Visit: Payer: Medicaid Other | Admitting: Family Medicine

## 2021-07-06 DIAGNOSIS — G43709 Chronic migraine without aura, not intractable, without status migrainosus: Secondary | ICD-10-CM

## 2021-07-06 NOTE — Progress Notes (Signed)
Botox- 200 units x 1 vial Lot: [id number] Expiration: 12/2023 NDC: 8257-4935-52  Bacteriostatic 0.9% Sodium Chloride- 12mL total Lot: [id number] Expiration: 08/05/2021 NDC: 9539-6728-97  Dx: V15.041. B/B

## 2021-10-04 DIAGNOSIS — Z0271 Encounter for disability determination: Secondary | ICD-10-CM

## 2021-10-04 NOTE — Progress Notes (Signed)
Consent Form Botulism Toxin Injection For Chronic Migraine   10/05/2020 ALL: She returns for Botox. She has had more migraines, recently, but doing well. Eletriptan works for abortive therapy.   07/06/2021 ALL: She continues to do well on Botox. 4-5 migraine days per month. Eletriptan works well. She has a migraine today but was scared to take abortive meds. She will take when she gets home.   04/04/2021 ALL: She is doing well, today. She continues to have about 4-8 migraine days each month. Eletriptan was effective for abortive therapy. She has decided to continue Botox and does not wish to pursue [facility] referral at this time.   12/01/2020 ALL: She continues Botox therapy. She feels it helps some but continues to have 4-8 migraines per month. She has tried and failed multiple prevention and abortive meds. She is not taking anything for abortive therapy. Previously tried sumatriptan, rizatriptan, frovatriptan, naproxen, toradol, Ubrelvy. She has not heard back from [facility] and was encouraged to call to schedule appt. I will call in eletriptan to see if this helps. Appropriate administration discussed with patient.   08/30/2020 ALL: She was referred to [facility] for intractable migraines, not relieved with abortive therapies. She has about 4-5 migraines per month. She does not use abortive therapies as they are ineffective. Botox seems to be helping. She is trying to avoid triggers.   Interval history 05/13/2020 AA: has a tooth infection and getting more migraines since then but still she has done exceptionally well with this treatment. >50% reduction in frequency of migraines and headaches since starting botox. Patient has tried multiple acute meds.Clenching is a problem. She feels as though she still has severe headaches, nothing we have given her has helped with acute management and the migraines she still gets are severe we will refer to [place].   [address]: [place]  Reviewed orally  with patient, additionally signature is on file:  Botulism toxin has been approved by the Federal drug administration for treatment of chronic migraine. Botulism toxin does not cure chronic migraine and it may not be effective in some patients.  The administration of botulism toxin is accomplished by injecting a small amount of toxin into the muscles of the neck and head. Dosage must be titrated for each individual. Any benefits resulting from botulism toxin tend to wear off after 3 months with a repeat injection required if benefit is to be maintained. Injections are usually done every 3-4 months with maximum effect peak achieved by about 2 or 3 weeks. Botulism toxin is expensive and you should be sure of what costs you will incur resulting from the injection.  The side effects of botulism toxin use for chronic migraine may include:   -Transient, and usually mild, facial weakness with facial injections  -Transient, and usually mild, head or neck weakness with head/neck injections  -Reduction or loss of forehead facial animation due to forehead muscle weakness  -Eyelid drooping  -Dry eye  -Pain at the site of injection or bruising at the site of injection  -Double vision  -Potential unknown long term risks  Contraindications: You should not have Botox if you are pregnant, nursing, allergic to albumin, have an infection, skin condition, or muscle weakness at the site of the injection, or have myasthenia gravis, Lambert-Eaton syndrome, or ALS.  It is also possible that as with any injection, there may be an allergic reaction or no effect from the medication. Reduced effectiveness after repeated injections is sometimes seen and rarely  infection at the injection site may occur. All care will be taken to prevent these side effects. If therapy is given over a long time, atrophy and wasting in the muscle injected may occur. Occasionally the patient's become refractory to treatment because they develop  antibodies to the toxin. In this event, therapy needs to be modified.  I have read the above information and consent to the administration of botulism toxin.    BOTOX PROCEDURE NOTE FOR MIGRAINE HEADACHE    Contraindications and precautions discussed with patient(above). Aseptic procedure was observed and patient tolerated procedure. Procedure performed by Debbora Presto, FNP-C.   The condition has existed for more than 6 months, and pt does not have a diagnosis of ALS, Myasthenia Gravis or Lambert-Eaton Syndrome.  Risks and benefits of injections discussed and pt agrees to proceed with the procedure.  Written consent obtained  These injections are medically necessary. Pt  receives good benefits from these injections. These injections do not cause sedations or hallucinations which the oral therapies may cause.  Description of procedure:  The patient was placed in a sitting position. The standard protocol was used for Botox as follows, with 5 units of Botox injected at each site:   -Procerus muscle, midline injection  -Corrugator muscle, bilateral injection  -Frontalis muscle, bilateral injection, with 2 sites each side, medial injection was performed in the upper one third of the frontalis muscle, in the region vertical from the medial inferior edge of the superior orbital rim. The lateral injection was again in the upper one third of the forehead vertically above the lateral limbus of the cornea, 1.5 cm lateral to the medial injection site.  -Temporalis muscle injection, 5 sites, bilaterally. The first injection was 3 cm above the tragus of the ear, second injection site was 1.5 cm to 3 cm up from the first injection site in line with the tragus of the ear. The third injection site was 1.5-3 cm forward between the first 2 injection sites. The fourth injection site was 1.5 cm posterior to the second injection site. 5th site laterally in the temporalis  muscleat the level of the outer  canthus.  -Occipitalis muscle injection, 3 sites, bilaterally. The first injection was done one half way between the occipital protuberance and the tip of the mastoid process behind the ear. The second injection site was done lateral and superior to the first, 1 fingerbreadth from the first injection. The third injection site was 1 fingerbreadth superiorly and medially from the first injection site.  -Cervical paraspinal muscle injection, 2 sites, bilateral knee first injection site was 1 cm from the midline of the cervical spine, 3 cm inferior to the lower border of the occipital protuberance. The second injection site was 1.5 cm superiorly and laterally to the first injection site.  -Trapezius muscle injection was performed at 3 sites, bilaterally. The first injection site was in the upper trapezius muscle halfway between the inflection point of the neck, and the acromion. The second injection site was one half way between the acromion and the first injection site. The third injection was done between the first injection site and the inflection point of the neck.   Will return for repeat injection in 3 months.   A 200 units of Botox prepared, 155 unites injected and 45 units of Botox was wasted. The patient tolerated the procedure well, there were no complications of the above procedure.

## 2021-10-05 ENCOUNTER — Ambulatory Visit: Payer: Medicaid Other | Admitting: Family Medicine

## 2021-10-05 ENCOUNTER — Other Ambulatory Visit: Payer: Self-pay

## 2021-10-05 VITALS — BP 118/80 | HR 86 | Ht 66.0 in | Wt 234.5 lb

## 2021-10-05 DIAGNOSIS — G43709 Chronic migraine without aura, not intractable, without status migrainosus: Secondary | ICD-10-CM

## 2021-10-05 NOTE — Progress Notes (Signed)
Botox- 200 units x 1 vial °Lot: [id number] °Expiration: 05/2024 °NDC: 0023-3921-02 ° °Bacteriostatic 0.9% Sodium Chloride- 4mL total °Lot: [id number] °Expiration: 04/19/2023 °NDC: 0409-1966-02 ° °Dx: G43.709 °B/B  °

## 2022-01-02 ENCOUNTER — Telehealth: Payer: Self-pay | Admitting: Family Medicine

## 2022-01-02 NOTE — Telephone Encounter (Signed)
Called [facility] to initiate a PA for Botox, PA # [id number]. PA is pending. ?

## 2022-01-09 ENCOUNTER — Encounter: Payer: Medicaid Other | Admitting: Family Medicine

## 2022-01-09 NOTE — Progress Notes (Signed)
? ?Consent Form ?Botulism Toxin Injection For Chronic Migraine ? ?01/09/2022: She returns for Botox. She is doing well. She continues eletriptan for abortive therapy.  ? ?10/05/2020 ALL: She returns for Botox. She has had more migraines, recently, but doing well. Eletriptan works for abortive therapy.  ? ?07/06/2021 ALL: She continues to do well on Botox. 4-5 migraine days per month. Eletriptan works well. She has a migraine today but was scared to take abortive meds. She will take when she gets home.  ? ?04/04/2021 ALL: She is doing well, today. She continues to have about 4-8 migraine days each month. Eletriptan was effective for abortive therapy. She has decided to continue Botox and does not wish to pursue [facility] referral at this time.  ? ?12/01/2020 ALL: She continues Botox therapy. She feels it helps some but continues to have 4-8 migraines per month. She has tried and failed multiple prevention and abortive meds. She is not taking anything for abortive therapy. Previously tried sumatriptan, rizatriptan, frovatriptan, naproxen, toradol, Ubrelvy. She has not heard back from [facility] and was encouraged to call to schedule appt. I will call in eletriptan to see if this helps. Appropriate administration discussed with patient.  ? ?08/30/2020 ALL: She was referred to [facility] for intractable migraines, not relieved with abortive therapies. She has about 4-5 migraines per month. She does not use abortive therapies as they are ineffective. Botox seems to be helping. She is trying to avoid triggers.  ? ?Interval history 05/13/2020 AA: has a tooth infection and getting more migraines since then but still she has done exceptionally well with this treatment. >50% reduction in frequency of migraines and headaches since starting botox. Patient has tried multiple acute meds.Clenching is a problem. She feels as though she still has severe headaches, nothing we have given her has helped with acute management and the migraines she still  gets are severe we will refer to [place].  ? ?[address]: [place] ? ?Reviewed orally with patient, additionally signature is on file: ? ?Botulism toxin has been approved by the Federal drug administration for treatment of chronic migraine. Botulism toxin does not cure chronic migraine and it may not be effective in some patients. ? ?The administration of botulism toxin is accomplished by injecting a small amount of toxin into the muscles of the neck and head. Dosage must be titrated for each individual. Any benefits resulting from botulism toxin tend to wear off after 3 months with a repeat injection required if benefit is to be maintained. Injections are usually done every 3-4 months with maximum effect peak achieved by about 2 or 3 weeks. Botulism toxin is expensive and you should be sure of what costs you will incur resulting from the injection. ? ?The side effects of botulism toxin use for chronic migraine may include: ? ? -Transient, and usually mild, facial weakness with facial injections ? -Transient, and usually mild, head or neck weakness with head/neck injections ? -Reduction or loss of forehead facial animation due to forehead muscle weakness ? -Eyelid drooping ? -Dry eye ? -Pain at the site of injection or bruising at the site of injection ? -Double vision ? -Potential unknown long term risks ? ?Contraindications: You should not have Botox if you are pregnant, nursing, allergic to albumin, have an infection, skin condition, or muscle weakness at the site of the injection, or have myasthenia gravis, Lambert-Eaton syndrome, or ALS. ? ?It is also possible that as with any injection, there may be an allergic reaction  or no effect from the medication. Reduced effectiveness after repeated injections is sometimes seen and rarely infection at the injection site may occur. All care will be taken to prevent these side effects. If therapy is given over a long time, atrophy and wasting in the  muscle injected may occur. Occasionally the patient's become refractory to treatment because they develop antibodies to the toxin. In this event, therapy needs to be modified. ? ?I have read the above information and consent to the administration of botulism toxin. ? ? ? ?BOTOX PROCEDURE NOTE FOR MIGRAINE HEADACHE ? ? ? ?Contraindications and precautions discussed with patient(above). Aseptic procedure was observed and patient tolerated procedure. Procedure performed by Debbora Presto, FNP-C.  ? ?The condition has existed for more than 6 months, and pt does not have a diagnosis of ALS, Myasthenia Gravis or Lambert-Eaton Syndrome.  Risks and benefits of injections discussed and pt agrees to proceed with the procedure.  Written consent obtained ? ?These injections are medically necessary. Pt  receives good benefits from these injections. These injections do not cause sedations or hallucinations which the oral therapies may cause. ? ?Description of procedure: ? ?The patient was placed in a sitting position. The standard protocol was used for Botox as follows, with 5 units of Botox injected at each site: ? ? ?-Procerus muscle, midline injection ? ?-Corrugator muscle, bilateral injection ? ?-Frontalis muscle, bilateral injection, with 2 sites each side, medial injection was performed in the upper one third of the frontalis muscle, in the region vertical from the medial inferior edge of the superior orbital rim. The lateral injection was again in the upper one third of the forehead vertically above the lateral limbus of the cornea, 1.5 cm lateral to the medial injection site. ? ?-Temporalis muscle injection, 5 sites, bilaterally. The first injection was 3 cm above the tragus of the ear, second injection site was 1.5 cm to 3 cm up from the first injection site in line with the tragus of the ear. The third injection site was 1.5-3 cm forward between the first 2 injection sites. The fourth injection site was 1.5 cm posterior to  the second injection site. 5th site laterally in the temporalis  muscleat the level of the outer canthus. ? ?-Occipitalis muscle injection, 3 sites, bilaterally. The first injection was done one half way between the occipital protuberance and the tip of the mastoid process behind the ear. The second injection site was done lateral and superior to the first, 1 fingerbreadth from the first injection. The third injection site was 1 fingerbreadth superiorly and medially from the first injection site. ? ?-Cervical paraspinal muscle injection, 2 sites, bilateral knee first injection site was 1 cm from the midline of the cervical spine, 3 cm inferior to the lower border of the occipital protuberance. The second injection site was 1.5 cm superiorly and laterally to the first injection site. ? ?-Trapezius muscle injection was performed at 3 sites, bilaterally. The first injection site was in the upper trapezius muscle halfway between the inflection point of the neck, and the acromion. The second injection site was one half way between the acromion and the first injection site. The third injection was done between the first injection site and the inflection point of the neck. ? ? ?Will return for repeat injection in 3 months. ? ? ?A 200 units of Botox prepared, 155 unites injected and 45 units of Botox was wasted. The patient tolerated the procedure well, there were no complications of the above procedure. ? ? ?

## 2022-01-09 NOTE — Progress Notes (Signed)
Botox- 200 units x 1 vial ?Lot: [id number] ?Expiration: 06/2024 ?[place]: [id number] ? ?Bacteriostatic 0.9% Sodium Chloride- 58m total ?Lot: [id number]?Expiration: 04/19/2023 ?[place] [address]04? ?Dx: GH99.774?B/B  ?

## 2022-01-10 ENCOUNTER — Emergency Department (HOSPITAL_BASED_OUTPATIENT_CLINIC_OR_DEPARTMENT_OTHER): Payer: Medicaid Other

## 2022-01-10 ENCOUNTER — Other Ambulatory Visit: Payer: Self-pay

## 2022-01-10 ENCOUNTER — Emergency Department (HOSPITAL_BASED_OUTPATIENT_CLINIC_OR_DEPARTMENT_OTHER)
Admission: EM | Admit: 2022-01-10 | Discharge: 2022-01-11 | Disposition: A | Discharge status: Home / Self Care | Payer: Medicaid Other | Attending: Emergency Medicine | Admitting: Emergency Medicine

## 2022-01-10 ENCOUNTER — Encounter (HOSPITAL_BASED_OUTPATIENT_CLINIC_OR_DEPARTMENT_OTHER): Payer: Self-pay | Admitting: Emergency Medicine

## 2022-01-10 DIAGNOSIS — W25XXXA Contact with sharp glass, initial encounter: Secondary | ICD-10-CM | POA: Insufficient documentation

## 2022-01-10 DIAGNOSIS — S61213A Laceration without foreign body of left middle finger without damage to nail, initial encounter: Secondary | ICD-10-CM | POA: Insufficient documentation

## 2022-01-10 DIAGNOSIS — S65503A Unspecified injury of blood vessel of left middle finger, initial encounter: Secondary | ICD-10-CM | POA: Diagnosis present

## 2022-01-10 DIAGNOSIS — Z23 Encounter for immunization: Secondary | ICD-10-CM | POA: Diagnosis not present

## 2022-01-10 DIAGNOSIS — M795 Residual foreign body in soft tissue: Secondary | ICD-10-CM

## 2022-01-10 MED ORDER — TETANUS-DIPHTH-ACELL PERTUSSIS 5-2.5-18.5 LF-MCG/0.5 IM SUSY
0.5000 mL | PREFILLED_SYRINGE | Freq: Once | INTRAMUSCULAR | Status: AC
  Administered 2022-01-10: 0.5 mL via INTRAMUSCULAR
  Filled 2022-01-10: qty 0.5

## 2022-01-10 MED ORDER — CEPHALEXIN 500 MG PO CAPS: 500.0000 mg | ORAL_CAPSULE | Freq: Two times a day (BID) | ORAL | 0 refills | Status: DC

## 2022-01-10 MED ORDER — CEPHALEXIN 250 MG PO CAPS
500.0000 mg | ORAL_CAPSULE | Freq: Once | ORAL | Status: AC
  Administered 2022-01-10: 500 mg via ORAL
  Filled 2022-01-10: qty 2

## 2022-01-10 NOTE — ED Triage Notes (Signed)
Reports left middle finger laceration that occurred 3 weeks ago. Cut finger on glass plate. Did not have it checked out at the time. Area has since healed, but pt reports still feeling as if there is glass present under the skin. Also endorses numbness in tip of finger and pain radiating up left arm.  ?

## 2022-01-10 NOTE — ED Provider Notes (Signed)
?[facility] EMERGENCY DEPARTMENT ?Provider Note ? ? ?CSN: [id number] ?Arrival date & time: 01/10/22  2107 ? ?  ? ?History ? ?Chief Complaint  ?Patient presents with  ? Finger Injury  ? ? ?Kelly Marsh is a 46 y.o. female. ? ?The history is provided by the patient.  ?Foreign Body ?Location:  Skin ?Suspected object: dish. ?Pain quality:  Aching ?Pain severity:  Moderate ?Duration:  3 weeks ?Timing:  Constant ?Progression:  Unchanged ?Chronicity:  New ?Worsened by:  Nothing ?Ineffective treatments:  None tried ?Associated symptoms: no trouble swallowing and no vomiting   ?Got a piece of a dish in the left middle finger over 3 weeks ago.  Tetanus is not UTD.   ?  ? ?Home Medications ?Prior to Admission medications   ?Medication Sig Start Date End Date Taking? Authorizing Provider  ?botulinum toxin Type A (BOTOX) 100 units SOLR injection PROVIDER TO INJECT 155 UNITS INTO THE MUSCLES OF THE HEAD AND NECK EVERY 3 MONTHS. DISCARD REMAINDER. 05/04/20   Melvenia Beam, MD  ?Cholecalciferol (VITAMIN D3) 1.25 MG (50000 UT) CAPS Take 1 capsule by mouth. Every other week    [provider]  ?desogestrel-ethinyl estradiol (VELIVET) 0.1/0.125/0.15 -0.025 MG tablet Take 1 tablet by mouth daily.    [provider]  ?eletriptan (RELPAX) 40 MG tablet Take 1 tablet (40 mg total) by mouth as needed for migraine or headache. May repeat in 2 hours if headache persists or recurs. 04/05/21   Debbora Presto, NP  ?   ? ?Allergies    ?Patient has no known allergies.   ? ?Review of Systems   ?Review of Systems  ?Constitutional:  Negative for fever.  ?HENT:  Negative for facial swelling and trouble swallowing.   ?Eyes:  Negative for photophobia.  ?Respiratory:  Negative for wheezing and stridor.   ?Cardiovascular:  Negative for leg swelling.  ?Gastrointestinal:  Negative for vomiting.  ?Musculoskeletal:  Negative for neck stiffness.  ?Skin:  Negative for wound.  ?Neurological:  Negative for facial asymmetry.   ?Psychiatric/Behavioral:  Negative for agitation.   ?All other systems reviewed and are negative. ? ?Physical Exam ?Updated Vital Signs ?BP (!) 140/100   Pulse 85   Temp 98.6 ?F (37 ?C) (Oral)   Resp 16   Wt 103 kg   SpO2 100%   BMI 36.64 kg/m?  ?Physical Exam ?Vitals and nursing note reviewed.  ?Constitutional:   ?   General: She is not in acute distress. ?   Appearance: Normal appearance.  ?HENT:  ?   Head: Normocephalic and atraumatic.  ?   Nose: Nose normal.  ?Eyes:  ?   Conjunctiva/sclera: Conjunctivae normal.  ?   Pupils: Pupils are equal, round, and reactive to light.  ?Cardiovascular:  ?   Rate and Rhythm: Normal rate and regular rhythm.  ?   Pulses: Normal pulses.  ?   Heart sounds: Normal heart sounds.  ?Pulmonary:  ?   Effort: Pulmonary effort is normal.  ?   Breath sounds: Normal breath sounds.  ?Abdominal:  ?   General: Bowel sounds are normal.  ?   Tenderness: There is no abdominal tenderness.  ?Musculoskeletal:     ?   General: No tenderness. Normal range of motion.  ?   Left hand: Normal. No swelling, deformity, lacerations, tenderness or bony tenderness. Normal range of motion. Normal strength. Normal sensation. There is no disruption of two-point discrimination. Normal capillary refill. Normal pulse.  ?   Cervical back: Normal range of  motion and neck supple.  ?Skin: ?   General: Skin is warm and dry.  ?Neurological:  ?   General: No focal deficit present.  ?   Mental Status: She is alert and oriented to person, place, and time.  ?   Deep Tendon Reflexes: Reflexes normal.  ?   Comments: Left hand and middle finger are NVI, sensation is intact to confrontation.  5/5 strength cap refill < 2 seconds to all digits of the left hand 3+ radial pulse.  FB is not palpable.    ?Psychiatric:     ?   Mood and Affect: Mood normal.     ?   Behavior: Behavior normal.  ? ? ?ED Results / Procedures / Treatments   ?Labs ?(all labs ordered are listed, but only abnormal results are displayed) ?Labs Reviewed -  No data to display ? ?EKG ?None ? ?Radiology ?DG Finger Middle Left ? ?Result Date: 01/10/2022 ?CLINICAL DATA:  Left middle finger injury EXAM: LEFT MIDDLE FINGER 2+V COMPARISON:  None. FINDINGS: Normal alignment. No acute fracture or dislocation. Joint spaces are preserved. There are 2 retained radiopaque foreign body seed within the volar soft tissues of the middle finger subjacent to the distal phalanx measuring 3 mm and 0.8 mm. IMPRESSION: Two retained radiopaque foreign bodies within the a distal volar aspect of the left middle finger. Electronically Signed   By: Fidela Salisbury M.D.   On: 01/10/2022 21:57   ? ?Procedures ?Procedures  ? ? ?Medications Ordered in ED ?Medications  ?Tdap (BOOSTRIX) injection 0.5 mL (has no administration in time range)  ? ? ?ED Course/ Medical Decision Making/ A&P ?  ?                        ?Medical Decision Making ?FB in left middle finger x > 3 weeks.  Wound has healed tetanus is not UTD ? ?Amount and/or Complexity of Data Reviewed ?External Data Reviewed: notes. ?   Details: outside notes reviewed ?Radiology: ordered and independent interpretation performed. ?   Details: FB on the bone in the middle finger ? ?Risk ?Prescription drug management. ?Risk Details: Patient had tetanus updated.  Will start keflex in preparation of FB extraction by surgery.  Have referred the patient to hand surgery as wound has healed.  I have impressed upon the patient is imperative that she contact hand surgery in the am this was also printed on discharge paperwork.   ? ? ? ?Final Clinical Impression(s) / ED Diagnoses ?Final diagnoses:  ?None  ?Return for intractable cough, coughing up blood, fevers > 100.4 unrelieved by medication, shortness of breath, intractable vomiting, chest pain, shortness of breath, weakness, numbness, changes in speech, facial asymmetry, abdominal pain, passing out, Inability to tolerate liquids or food, cough, altered mental status or any concerns. No signs of systemic  illness or infection. The patient is nontoxic-appearing on exam and vital signs are within normal limits.  ?I have reviewed the triage vital signs and the nursing notes. Pertinent labs & imaging results that were available during my care of the patient were reviewed by me and considered in my medical decision making (see chart for details). After history, exam, and medical workup I feel the patient has been appropriately medically screened and is safe for discharge home. Pertinent diagnoses were discussed with the patient. Patient was given return precautions. ?  ?  ? ?Rx / DC Orders ?ED Discharge Orders   ? ? None  ? ?  ? ? ?  ?  Lanett Lasorsa, MD ?01/10/22 2334 ? ?

## 2022-01-11 NOTE — ED Notes (Signed)
Pt NAD, a/ox4. Pt verbalizes understanding of all DC and f/u instructions. All questions answered. Pt walks with steady gait to lobby at DC.  ? ?

## 2022-03-07 ENCOUNTER — Telehealth: Payer: Self-pay | Admitting: Family Medicine

## 2022-03-07 NOTE — Telephone Encounter (Signed)
Sent pt a mychart message to get Botox appt for Wednesday rescheduled.

## 2022-03-08 ENCOUNTER — Ambulatory Visit: Payer: Medicaid Other | Admitting: Family Medicine

## 2022-03-27 ENCOUNTER — Ambulatory Visit (INDEPENDENT_AMBULATORY_CARE_PROVIDER_SITE_OTHER): Payer: Medicaid Other | Admitting: Family Medicine

## 2022-03-27 DIAGNOSIS — G43709 Chronic migraine without aura, not intractable, without status migrainosus: Secondary | ICD-10-CM | POA: Diagnosis not present

## 2022-03-27 NOTE — Progress Notes (Signed)
Botox- 200 units x 1 vial Lot: [id number] Expiration: 10/2024 [facility]: 5831-6742-55  Bacteriostatic 0.9% Sodium Chloride- 37m total Lot: [id number]: 05/20/2023 [place]: [address]-46 Dx: [id number]/B

## 2022-03-27 NOTE — Progress Notes (Signed)
Consent Form Botulism Toxin Injection For Chronic Migraine   03/27/2022 ALL: Kelly Marsh returns for Botox. Last procedure 09/2021. She reports near daily headaches since missing last appt in April. Most are migrainous. She continues eletriptan for abortive therapy.   10/05/2020 ALL: She returns for Botox. She has had more migraines, recently, but doing well. Eletriptan works for abortive therapy.   07/06/2021 ALL: She continues to do well on Botox. 4-5 migraine days per month. Eletriptan works well. She has a migraine today but was scared to take abortive meds. She will take when she gets home.   04/04/2021 ALL: She is doing well, today. She continues to have about 4-8 migraine days each month. Eletriptan was effective for abortive therapy. She has decided to continue Botox and does not wish to pursue [facility] referral at this time.   12/01/2020 ALL: She continues Botox therapy. She feels it helps some but continues to have 4-8 migraines per month. She has tried and failed multiple prevention and abortive meds. She is not taking anything for abortive therapy. Previously tried sumatriptan, rizatriptan, frovatriptan, naproxen, toradol, Ubrelvy. She has not heard back from [facility] and was encouraged to call to schedule appt. I will call in eletriptan to see if this helps. Appropriate administration discussed with patient.   08/30/2020 ALL: She was referred to [facility] for intractable migraines, not relieved with abortive therapies. She has about 4-5 migraines per month. She does not use abortive therapies as they are ineffective. Botox seems to be helping. She is trying to avoid triggers.   Interval history 05/13/2020 AA: has a tooth infection and getting more migraines since then but still she has done exceptionally well with this treatment. >50% reduction in frequency of migraines and headaches since starting botox. Patient has tried multiple acute meds.Clenching is a problem. She feels as though she still has severe  headaches, nothing we have given her has helped with acute management and the migraines she still gets are severe we will refer to [place].   [address]: [place]  Reviewed orally with patient, additionally signature is on file:  Botulism toxin has been approved by the Federal drug administration for treatment of chronic migraine. Botulism toxin does not cure chronic migraine and it may not be effective in some patients.  The administration of botulism toxin is accomplished by injecting a small amount of toxin into the muscles of the neck and head. Dosage must be titrated for each individual. Any benefits resulting from botulism toxin tend to wear off after 3 months with a repeat injection required if benefit is to be maintained. Injections are usually done every 3-4 months with maximum effect peak achieved by about 2 or 3 weeks. Botulism toxin is expensive and you should be sure of what costs you will incur resulting from the injection.  The side effects of botulism toxin use for chronic migraine may include:   -Transient, and usually mild, facial weakness with facial injections  -Transient, and usually mild, head or neck weakness with head/neck injections  -Reduction or loss of forehead facial animation due to forehead muscle weakness  -Eyelid drooping  -Dry eye  -Pain at the site of injection or bruising at the site of injection  -Double vision  -Potential unknown long term risks  Contraindications: You should not have Botox if you are pregnant, nursing, allergic to albumin, have an infection, skin condition, or muscle weakness at the site of the injection, or have myasthenia gravis, Lambert-Eaton syndrome, or ALS.  It is also possible that as with any injection, there may be an allergic reaction or no effect from the medication. Reduced effectiveness after repeated injections is sometimes seen and rarely infection at the injection site may occur. All care will be taken  to prevent these side effects. If therapy is given over a long time, atrophy and wasting in the muscle injected may occur. Occasionally the patient's become refractory to treatment because they develop antibodies to the toxin. In this event, therapy needs to be modified.  I have read the above information and consent to the administration of botulism toxin.    BOTOX PROCEDURE NOTE FOR MIGRAINE HEADACHE    Contraindications and precautions discussed with patient(above). Aseptic procedure was observed and patient tolerated procedure. Procedure performed by Debbora Presto, FNP-C.   The condition has existed for more than 6 months, and pt does not have a diagnosis of ALS, Myasthenia Gravis or Lambert-Eaton Syndrome.  Risks and benefits of injections discussed and pt agrees to proceed with the procedure.  Written consent obtained  These injections are medically necessary. Pt  receives good benefits from these injections. These injections do not cause sedations or hallucinations which the oral therapies may cause.  Description of procedure:  The patient was placed in a sitting position. The standard protocol was used for Botox as follows, with 5 units of Botox injected at each site:   -Procerus muscle, midline injection  -Corrugator muscle, bilateral injection  -Frontalis muscle, bilateral injection, with 2 sites each side, medial injection was performed in the upper one third of the frontalis muscle, in the region vertical from the medial inferior edge of the superior orbital rim. The lateral injection was again in the upper one third of the forehead vertically above the lateral limbus of the cornea, 1.5 cm lateral to the medial injection site.  -Temporalis muscle injection, 5 sites, bilaterally. The first injection was 3 cm above the tragus of the ear, second injection site was 1.5 cm to 3 cm up from the first injection site in line with the tragus of the ear. The third injection site was 1.5-3 cm  forward between the first 2 injection sites. The fourth injection site was 1.5 cm posterior to the second injection site. 5th site laterally in the temporalis  muscleat the level of the outer canthus.  -Occipitalis muscle injection, 3 sites, bilaterally. The first injection was done one half way between the occipital protuberance and the tip of the mastoid process behind the ear. The second injection site was done lateral and superior to the first, 1 fingerbreadth from the first injection. The third injection site was 1 fingerbreadth superiorly and medially from the first injection site.  -Cervical paraspinal muscle injection, 2 sites, bilateral knee first injection site was 1 cm from the midline of the cervical spine, 3 cm inferior to the lower border of the occipital protuberance. The second injection site was 1.5 cm superiorly and laterally to the first injection site.  -Trapezius muscle injection was performed at 3 sites, bilaterally. The first injection site was in the upper trapezius muscle halfway between the inflection point of the neck, and the acromion. The second injection site was one half way between the acromion and the first injection site. The third injection was done between the first injection site and the inflection point of the neck.   Will return for repeat injection in 3 months.   A 200 units of Botox prepared, 155 unites injected and 45 units of Botox was  wasted. The patient tolerated the procedure well, there were no complications of the above procedure.

## 2022-04-03 ENCOUNTER — Ambulatory Visit: Payer: Medicaid Other | Admitting: Family Medicine

## 2022-06-13 ENCOUNTER — Other Ambulatory Visit (HOSPITAL_COMMUNITY): Payer: Self-pay

## 2022-06-13 NOTE — Telephone Encounter (Signed)
Can you see if this PA was approved for botox and if so, the dates and confirmation number?

## 2022-06-15 ENCOUNTER — Telehealth (HOSPITAL_COMMUNITY): Payer: Self-pay

## 2022-06-15 NOTE — Progress Notes (Signed)
Consent Form Botulism Toxin Injection For Chronic Migraine   06/19/2022 ALL: Kelly Marsh returns for Botox. She reports improvement over the past 12 weeks. She may have 3-4 migraines a month. Eletriptan works well for abortive therapy. She reports BP is usually 120-130/80's. Today's reading is not typical.   03/27/2022 ALL: Kelly Marsh returns for Botox. Last procedure 09/2021. She reports near daily headaches since missing last appt in April. Most are migrainous. She continues eletriptan for abortive therapy.   10/05/2020 ALL: She returns for Botox. She has had more migraines, recently, but doing well. Eletriptan works for abortive therapy.   07/06/2021 ALL: She continues to do well on Botox. 4-5 migraine days per month. Eletriptan works well. She has a migraine today but was scared to take abortive meds. She will take when she gets home.   04/04/2021 ALL: She is doing well, today. She continues to have about 4-8 migraine days each month. Eletriptan was effective for abortive therapy. She has decided to continue Botox and does not wish to pursue [facility] referral at this time.   12/01/2020 ALL: She continues Botox therapy. She feels it helps some but continues to have 4-8 migraines per month. She has tried and failed multiple prevention and abortive meds. She is not taking anything for abortive therapy. Previously tried sumatriptan, rizatriptan, frovatriptan, naproxen, toradol, Ubrelvy. She has not heard back from [facility] and was encouraged to call to schedule appt. I will call in eletriptan to see if this helps. Appropriate administration discussed with patient.   08/30/2020 ALL: She was referred to [facility] for intractable migraines, not relieved with abortive therapies. She has about 4-5 migraines per month. She does not use abortive therapies as they are ineffective. Botox seems to be helping. She is trying to avoid triggers.   Interval history 05/13/2020 AA: has a tooth infection and getting more migraines since then  but still she has done exceptionally well with this treatment. >50% reduction in frequency of migraines and headaches since starting botox. Patient has tried multiple acute meds.Clenching is a problem. She feels as though she still has severe headaches, nothing we have given her has helped with acute management and the migraines she still gets are severe we will refer to [place].   [address]: [place]  Reviewed orally with patient, additionally signature is on file:  Botulism toxin has been approved by the Federal drug administration for treatment of chronic migraine. Botulism toxin does not cure chronic migraine and it may not be effective in some patients.  The administration of botulism toxin is accomplished by injecting a small amount of toxin into the muscles of the neck and head. Dosage must be titrated for each individual. Any benefits resulting from botulism toxin tend to wear off after 3 months with a repeat injection required if benefit is to be maintained. Injections are usually done every 3-4 months with maximum effect peak achieved by about 2 or 3 weeks. Botulism toxin is expensive and you should be sure of what costs you will incur resulting from the injection.  The side effects of botulism toxin use for chronic migraine may include:   -Transient, and usually mild, facial weakness with facial injections  -Transient, and usually mild, head or neck weakness with head/neck injections  -Reduction or loss of forehead facial animation due to forehead muscle weakness  -Eyelid drooping  -Dry eye  -Pain at the site of injection or bruising at the site of injection  -Double vision  -Potential unknown long  term risks  Contraindications: You should not have Botox if you are pregnant, nursing, allergic to albumin, have an infection, skin condition, or muscle weakness at the site of the injection, or have myasthenia gravis, Lambert-Eaton syndrome, or ALS.  It is also  possible that as with any injection, there may be an allergic reaction or no effect from the medication. Reduced effectiveness after repeated injections is sometimes seen and rarely infection at the injection site may occur. All care will be taken to prevent these side effects. If therapy is given over a long time, atrophy and wasting in the muscle injected may occur. Occasionally the patient's become refractory to treatment because they develop antibodies to the toxin. In this event, therapy needs to be modified.  I have read the above information and consent to the administration of botulism toxin.    BOTOX PROCEDURE NOTE FOR MIGRAINE HEADACHE    Contraindications and precautions discussed with patient(above). Aseptic procedure was observed and patient tolerated procedure. Procedure performed by Debbora Presto, FNP-C.   The condition has existed for more than 6 months, and pt does not have a diagnosis of ALS, Myasthenia Gravis or Lambert-Eaton Syndrome.  Risks and benefits of injections discussed and pt agrees to proceed with the procedure.  Written consent obtained  These injections are medically necessary. Pt  receives good benefits from these injections. These injections do not cause sedations or hallucinations which the oral therapies may cause.  Description of procedure:  The patient was placed in a sitting position. The standard protocol was used for Botox as follows, with 5 units of Botox injected at each site:   -Procerus muscle, midline injection  -Corrugator muscle, bilateral injection  -Frontalis muscle, bilateral injection, with 2 sites each side, medial injection was performed in the upper one third of the frontalis muscle, in the region vertical from the medial inferior edge of the superior orbital rim. The lateral injection was again in the upper one third of the forehead vertically above the lateral limbus of the cornea, 1.5 cm lateral to the medial injection  site.  -Temporalis muscle injection, 5 sites, bilaterally. The first injection was 3 cm above the tragus of the ear, second injection site was 1.5 cm to 3 cm up from the first injection site in line with the tragus of the ear. The third injection site was 1.5-3 cm forward between the first 2 injection sites. The fourth injection site was 1.5 cm posterior to the second injection site. 5th site laterally in the temporalis  muscleat the level of the outer canthus.  -Occipitalis muscle injection, 3 sites, bilaterally. The first injection was done one half way between the occipital protuberance and the tip of the mastoid process behind the ear. The second injection site was done lateral and superior to the first, 1 fingerbreadth from the first injection. The third injection site was 1 fingerbreadth superiorly and medially from the first injection site.  -Cervical paraspinal muscle injection, 2 sites, bilateral knee first injection site was 1 cm from the midline of the cervical spine, 3 cm inferior to the lower border of the occipital protuberance. The second injection site was 1.5 cm superiorly and laterally to the first injection site.  -Trapezius muscle injection was performed at 3 sites, bilaterally. The first injection site was in the upper trapezius muscle halfway between the inflection point of the neck, and the acromion. The second injection site was one half way between the acromion and the first injection site. The third injection was  done between the first injection site and the inflection point of the neck.   Will return for repeat injection in 3 months.   A 200 units of Botox prepared, 155 unites injected and 45 units of Botox was wasted. The patient tolerated the procedure well, there were no complications of the above procedure.

## 2022-06-15 NOTE — Telephone Encounter (Signed)
[employer] verification submitted   Key #  [id number]

## 2022-06-16 ENCOUNTER — Telehealth (HOSPITAL_COMMUNITY): Payer: Self-pay

## 2022-06-19 ENCOUNTER — Ambulatory Visit (INDEPENDENT_AMBULATORY_CARE_PROVIDER_SITE_OTHER): Payer: Medicaid Other | Admitting: Family Medicine

## 2022-06-19 ENCOUNTER — Other Ambulatory Visit (HOSPITAL_COMMUNITY): Payer: Self-pay

## 2022-06-19 ENCOUNTER — Encounter: Payer: Self-pay | Admitting: Family Medicine

## 2022-06-19 VITALS — BP 150/112 | HR 103 | Ht 66.0 in | Wt 230.0 lb

## 2022-06-19 DIAGNOSIS — G43709 Chronic migraine without aura, not intractable, without status migrainosus: Secondary | ICD-10-CM

## 2022-06-19 MED ORDER — ONABOTULINUMTOXINA 200 UNITS IJ SOLR
155.0000 [IU] | Freq: Once | INTRAMUSCULAR | Status: AC
  Administered 2022-06-19: 155 [IU] via INTRAMUSCULAR

## 2022-06-19 NOTE — Telephone Encounter (Signed)
Pt was seen today in office for botox and it is charged as a buy and bill

## 2022-06-19 NOTE — Telephone Encounter (Signed)
Per Lyndel Safe at [facility]: "PA not required.  Can be filled at [facility]

## 2022-06-25 ENCOUNTER — Other Ambulatory Visit: Payer: Self-pay

## 2022-06-25 ENCOUNTER — Encounter (HOSPITAL_BASED_OUTPATIENT_CLINIC_OR_DEPARTMENT_OTHER): Payer: Self-pay | Admitting: Emergency Medicine

## 2022-06-25 ENCOUNTER — Emergency Department (HOSPITAL_BASED_OUTPATIENT_CLINIC_OR_DEPARTMENT_OTHER)
Admission: EM | Admit: 2022-06-25 | Discharge: 2022-06-25 | Disposition: A | Discharge status: Home / Self Care | Payer: Medicaid Other | Attending: Emergency Medicine | Admitting: Emergency Medicine

## 2022-06-25 ENCOUNTER — Emergency Department (HOSPITAL_BASED_OUTPATIENT_CLINIC_OR_DEPARTMENT_OTHER): Payer: Medicaid Other

## 2022-06-25 DIAGNOSIS — H538 Other visual disturbances: Secondary | ICD-10-CM | POA: Insufficient documentation

## 2022-06-25 DIAGNOSIS — R519 Headache, unspecified: Secondary | ICD-10-CM | POA: Diagnosis not present

## 2022-06-25 DIAGNOSIS — R0789 Other chest pain: Secondary | ICD-10-CM | POA: Diagnosis not present

## 2022-06-25 DIAGNOSIS — R079 Chest pain, unspecified: Secondary | ICD-10-CM

## 2022-06-25 LAB — BASIC METABOLIC PANEL
Anion gap: 6 (ref 5–15)
BUN: 14 mg/dL (ref 6–20)
CO2: 24 mmol/L (ref 22–32)
Calcium: 8.8 mg/dL — ABNORMAL LOW (ref 8.9–10.3)
Chloride: 109 mmol/L (ref 98–111)
Creatinine, Ser: 0.93 mg/dL (ref 0.44–1.00)
GFR, Estimated: >60 mL/min (ref >60)
Glucose, Bld: 100 mg/dL — ABNORMAL HIGH (ref 70–99)
Potassium: 4.3 mmol/L (ref 3.5–5.1)
Sodium: 139 mmol/L (ref 135–145)

## 2022-06-25 LAB — CBC WITH DIFFERENTIAL/PLATELET
Abs Immature Granulocytes: 0.03 10*3/uL (ref 0.00–0.07)
Basophils Absolute: 0.1 10*3/uL (ref 0.0–0.1)
Basophils Relative: 1 %
Eosinophils Absolute: 0.2 10*3/uL (ref 0.0–0.5)
Eosinophils Relative: 2 %
HCT: 41.8 % (ref 36.0–46.0)
Hemoglobin: 13.9 g/dL (ref 12.0–15.0)
Immature Granulocytes: 0 %
Lymphocytes Relative: 29 %
Lymphs Abs: 2.4 10*3/uL (ref 0.7–4.0)
MCH: 29.1 pg (ref 26.0–34.0)
MCHC: 33.3 g/dL (ref 30.0–36.0)
MCV: 87.6 fL (ref 80.0–100.0)
Monocytes Absolute: 0.7 10*3/uL (ref 0.1–1.0)
Monocytes Relative: 8 %
Neutro Abs: 4.9 10*3/uL (ref 1.7–7.7)
Neutrophils Relative %: 60 %
Platelets: 398 10*3/uL (ref 150–400)
RBC: 4.77 MIL/uL (ref 3.87–5.11)
RDW: 12.5 % (ref 11.5–15.5)
WBC: 8.3 10*3/uL (ref 4.0–10.5)
nRBC: 0 % (ref 0.0–0.2)

## 2022-06-25 LAB — CBG MONITORING, ED: Glucose-Capillary: 77 mg/dL (ref 70–99)

## 2022-06-25 LAB — TROPONIN I (HIGH SENSITIVITY): Troponin I (High Sensitivity): 3 ng/L (ref <18)

## 2022-06-25 MED ORDER — METOCLOPRAMIDE HCL 5 MG/ML IJ SOLN
10.0000 mg | Freq: Once | INTRAMUSCULAR | Status: AC
Start: 2022-06-25 — End: 2022-06-25
  Administered 2022-06-25: 10 mg via INTRAVENOUS
  Filled 2022-06-25: qty 2

## 2022-06-25 MED ORDER — DIPHENHYDRAMINE HCL 50 MG/ML IJ SOLN
25.0000 mg | Freq: Once | INTRAMUSCULAR | Status: AC
  Administered 2022-06-25: 25 mg via INTRAVENOUS
  Filled 2022-06-25: qty 1

## 2022-06-25 MED ORDER — SODIUM CHLORIDE 0.9 % IV BOLUS
1000.0000 mL | Freq: Once | INTRAVENOUS | Status: AC
  Administered 2022-06-25: 1000 mL via INTRAVENOUS

## 2022-06-25 MED ORDER — DEXAMETHASONE SODIUM PHOSPHATE 10 MG/ML IJ SOLN
10.0000 mg | Freq: Once | INTRAMUSCULAR | Status: AC
  Administered 2022-06-25: 10 mg via INTRAVENOUS
  Filled 2022-06-25: qty 1

## 2022-06-25 NOTE — ED Provider Notes (Signed)
[facility] EMERGENCY DEPARTMENT Provider Note   CSN: [id number] Arrival date & time: 06/25/22  1947     History  Chief Complaint  Patient presents with   Headache    Kelly Marsh is a 46 y.o. female.  Patient is a 46 year old female who presents with multiple complaints.  She has been having a headache.  She describes it as a sharp pain in the center of her head in the frontal region.  Its been going on for the 2 to 3 days and it is intermittent.  She says it will completely go away and come back again.  She says its about a 9 out of 10 now.  She does have a history of migraines but says this feels a bit different.  It was not sudden onset.  There is no associated neck pain.  She says she will have some intermittent blurry vision but no sustained vision loss.  No nausea or vomiting.  No fevers.  No recent head injuries.  She is also complaining of some intermittent pain in the center of her chest.  Sometimes its in her right side but mostly is in the center.  Bit worse with movement.  No shortness of breath.  No cough or cold symptoms.  No leg swelling.       Home Medications Prior to Admission medications   Medication Sig Start Date End Date Taking? Authorizing Provider  botulinum toxin Type A (BOTOX) 100 units SOLR injection PROVIDER TO INJECT 155 UNITS INTO THE MUSCLES OF THE HEAD AND NECK EVERY 3 MONTHS. DISCARD REMAINDER. 05/04/20   Melvenia Beam, MD  cephALEXin (KEFLEX) 500 MG capsule Take 1 capsule (500 mg total) by mouth 2 (two) times daily. Patient not taking: Reported on 06/19/2022 01/10/22   Palumbo, April, MD  Cholecalciferol (VITAMIN D3) 1.25 MG (50000 UT) CAPS Take 1 capsule by mouth. Every other week    [provider]  desogestrel-ethinyl estradiol (VELIVET) 0.1/0.125/0.15 -0.025 MG tablet Take 1 tablet by mouth daily.    [provider]  eletriptan (RELPAX) 40 MG tablet Take 1 tablet (40 mg total) by mouth as needed for migraine or headache.  May repeat in 2 hours if headache persists or recurs. 04/05/21   Debbora Presto, NP      Allergies    Patient has no known allergies.    Review of Systems   Review of Systems  Constitutional:  Negative for chills, diaphoresis, fatigue and fever.  HENT:  Negative for congestion, rhinorrhea and sneezing.   Eyes: Negative.   Respiratory:  Negative for cough, chest tightness and shortness of breath.   Cardiovascular:  Positive for chest pain. Negative for leg swelling.  Gastrointestinal:  Negative for abdominal pain, blood in stool, diarrhea, nausea and vomiting.  Genitourinary:  Negative for difficulty urinating, flank pain, frequency and hematuria.  Musculoskeletal:  Negative for arthralgias and back pain.  Skin:  Negative for rash.  Neurological:  Positive for headaches. Negative for dizziness, speech difficulty, weakness and numbness.    Physical Exam Updated Vital Signs BP (!) 139/96   Pulse 79   Temp 97.8 F (36.6 C) (Oral)   Resp 18   Ht '5\' 6"'$  (1.676 m)   Wt 103.4 kg   LMP 06/14/2022   SpO2 99%   BMI 36.80 kg/m  Physical Exam Constitutional:      Appearance: She is well-developed.  HENT:     Head: Normocephalic and atraumatic.  Eyes:     Pupils: Pupils  are equal, round, and reactive to light.  Neck:     Comments: No meningismus Cardiovascular:     Rate and Rhythm: Normal rate and regular rhythm.     Heart sounds: Normal heart sounds.  Pulmonary:     Effort: Pulmonary effort is normal. No respiratory distress.     Breath sounds: Normal breath sounds. No wheezing or rales.  Chest:     Chest wall: No tenderness.  Abdominal:     General: Bowel sounds are normal.     Palpations: Abdomen is soft.     Tenderness: There is no abdominal tenderness. There is no guarding or rebound.  Musculoskeletal:        General: Normal range of motion.     Cervical back: Normal range of motion and neck supple.     Comments: No edema or calf tenderness  Lymphadenopathy:     Cervical:  No cervical adenopathy.  Skin:    General: Skin is warm and dry.     Findings: No rash.  Neurological:     Mental Status: She is alert and oriented to person, place, and time.     Comments: Motor 5/5 all extremities Sensation grossly intact to LT all extremities Finger to Nose intact, no pronator drift CN II-XII grossly intact       ED Results / Procedures / Treatments   Labs (all labs ordered are listed, but only abnormal results are displayed) Labs Reviewed  BASIC METABOLIC PANEL - Abnormal; Notable for the following components:      Result Value   Glucose, Bld 100 (*)    Calcium 8.8 (*)    All other components within normal limits  CBC WITH DIFFERENTIAL/PLATELET  CBG MONITORING, ED  TROPONIN I (HIGH SENSITIVITY)    EKG EKG Interpretation  Date/Time:  Sunday June 25 2022 20:04:48 EDT Ventricular Rate:  82 PR Interval:  122 QRS Duration: 74 QT Interval:  360 QTC Calculation: 420 R Axis:   60 Text Interpretation: Normal sinus rhythm Normal ECG When compared with ECG of 15-Feb-2019 22:00, PREVIOUS ECG IS PRESENT since last tracing no significant change Confirmed by Malvin Johns [phone]) on 06/25/2022 9:08:30 PM  Radiology DG Chest 2 View  Result Date: 06/25/2022 CLINICAL DATA:  Chest pain. EXAM: CHEST - 2 VIEW COMPARISON:  Feb 15, 2019 FINDINGS: The heart size and mediastinal contours are within normal limits. A trace amount of atelectasis is seen within the left lung base. There is no evidence of acute infiltrate, pleural effusion or pneumothorax. The visualized skeletal structures are unremarkable. IMPRESSION: No active cardiopulmonary disease. Electronically Signed   By: Virgina Norfolk M.D.   On: 06/25/2022 21:35    Procedures Procedures    Medications Ordered in ED Medications  metoCLOPramide (REGLAN) injection 10 mg (10 mg Intravenous Given 06/25/22 2151)  dexamethasone (DECADRON) injection 10 mg (10 mg Intravenous Given 06/25/22 2150)  diphenhydrAMINE  (BENADRYL) injection 25 mg (25 mg Intravenous Given 06/25/22 2149)  sodium chloride 0.9 % bolus 1,000 mL (1,000 mLs Intravenous New Bag/Given 06/25/22 2153)    ED Course/ Medical Decision Making/ A&P                           Medical Decision Making Amount and/or Complexity of Data Reviewed Labs: ordered. Radiology: ordered.  Risk Prescription drug management.   Patient is a 46 year old who presents mostly with a headache.  She does have a history of migraines but says this headache is a  bit different.  Is mostly in the frontal area.  She does not have any concerns for sinus infection.  She does not have symptoms that sound more concerning for subarachnoid hemorrhage or meningitis.  Her headache is been intermittent over the last couple days so would doubt other etiologies such as enlarging tumor.  This point I did not feel that CT imaging was indicated.  She was given a migraine cocktail and has gotten much relief with this.  She only has a mild headache currently.  She does not have any focal neurologic deficits or suggestions of a stroke.  She did have some chest pain.  Mostly in the center of her chest.  It was reproducible on palpation.  She had labs that were nonconcerning.  Her troponin is negative.  No ischemic changes noted on EKG.  Chest x-ray two-view showed no acute abnormalities.  This was interpreted by me.  She does not have other symptoms that would be more concerning for PE or aortic dissection.  She is currently pain-free.  She was discharged home in good condition.  She was encouraged to have close follow-up with her primary care doctor.  Return precautions were given.  Final Clinical Impression(s) / ED Diagnoses Final diagnoses:  Acute nonintractable headache, unspecified headache type  Chest pain, unspecified type    Rx / DC Orders ED Discharge Orders     None         Malvin Johns, MD 06/25/22 2241

## 2022-06-25 NOTE — ED Notes (Signed)
Pt c/o headache x 2 days, dizziness and intermittent CP

## 2022-06-25 NOTE — ED Triage Notes (Signed)
Pt c/o pain to middle of forehead that feels like a "brain freeze", dizziness and CP intermittently today; sts feels  different than her migraines

## 2022-08-16 NOTE — Telephone Encounter (Signed)
error 

## 2022-09-12 ENCOUNTER — Ambulatory Visit: Payer: Medicaid Other | Admitting: Family Medicine

## 2022-09-19 ENCOUNTER — Ambulatory Visit (INDEPENDENT_AMBULATORY_CARE_PROVIDER_SITE_OTHER): Payer: Medicaid Other | Admitting: Family Medicine

## 2022-09-19 DIAGNOSIS — G43709 Chronic migraine without aura, not intractable, without status migrainosus: Secondary | ICD-10-CM | POA: Diagnosis not present

## 2022-09-19 MED ORDER — ONABOTULINUMTOXINA 200 UNITS IJ SOLR
155.0000 [IU] | Freq: Once | INTRAMUSCULAR | Status: AC
  Administered 2022-09-19: 155 [IU] via INTRAMUSCULAR

## 2022-09-19 NOTE — Progress Notes (Signed)
Consent Form Botulism Toxin Injection For Chronic Migraine  09/19/2022 ALL: Kelly Marsh returns for Botox. She continues to do well. Migraines are well managed.   06/19/2022 ALL: Kelly Marsh returns for Botox. She reports improvement over the past 12 weeks. She may have 3-4 migraines a month. Eletriptan works well for abortive therapy. She reports BP is usually 120-130/80's. Today's reading is not typical.   03/27/2022 ALL: Kelly Marsh returns for Botox. Last procedure 09/2021. She reports near daily headaches since missing last appt in April. Most are migrainous. She continues eletriptan for abortive therapy.   10/05/2020 ALL: She returns for Botox. She has had more migraines, recently, but doing well. Eletriptan works for abortive therapy.   07/06/2021 ALL: She continues to do well on Botox. 4-5 migraine days per month. Eletriptan works well. She has a migraine today but was scared to take abortive meds. She will take when she gets home.   04/04/2021 ALL: She is doing well, today. She continues to have about 4-8 migraine days each month. Eletriptan was effective for abortive therapy. She has decided to continue Botox and does not wish to pursue [facility] referral at this time.   12/01/2020 ALL: She continues Botox therapy. She feels it helps some but continues to have 4-8 migraines per month. She has tried and failed multiple prevention and abortive meds. She is not taking anything for abortive therapy. Previously tried sumatriptan, rizatriptan, frovatriptan, naproxen, toradol, Ubrelvy. She has not heard back from [facility] and was encouraged to call to schedule appt. I will call in eletriptan to see if this helps. Appropriate administration discussed with patient.   08/30/2020 ALL: She was referred to [facility] for intractable migraines, not relieved with abortive therapies. She has about 4-5 migraines per month. She does not use abortive therapies as they are ineffective. Botox seems to be helping. She is trying to avoid triggers.    Interval history 05/13/2020 AA: has a tooth infection and getting more migraines since then but still she has done exceptionally well with this treatment. >50% reduction in frequency of migraines and headaches since starting botox. Patient has tried multiple acute meds.Clenching is a problem. She feels as though she still has severe headaches, nothing we have given her has helped with acute management and the migraines she still gets are severe we will refer to [place].   [address]: [place]  Reviewed orally with patient, additionally signature is on file:  Botulism toxin has been approved by the Federal drug administration for treatment of chronic migraine. Botulism toxin does not cure chronic migraine and it may not be effective in some patients.  The administration of botulism toxin is accomplished by injecting a small amount of toxin into the muscles of the neck and head. Dosage must be titrated for each individual. Any benefits resulting from botulism toxin tend to wear off after 3 months with a repeat injection required if benefit is to be maintained. Injections are usually done every 3-4 months with maximum effect peak achieved by about 2 or 3 weeks. Botulism toxin is expensive and you should be sure of what costs you will incur resulting from the injection.  The side effects of botulism toxin use for chronic migraine may include:   -Transient, and usually mild, facial weakness with facial injections  -Transient, and usually mild, head or neck weakness with head/neck injections  -Reduction or loss of forehead facial animation due to forehead muscle weakness  -Eyelid drooping  -Dry eye  -Pain at the site  of injection or bruising at the site of injection  -Double vision  -Potential unknown long term risks  Contraindications: You should not have Botox if you are pregnant, nursing, allergic to albumin, have an infection, skin condition, or muscle weakness at the site  of the injection, or have myasthenia gravis, Lambert-Eaton syndrome, or ALS.  It is also possible that as with any injection, there may be an allergic reaction or no effect from the medication. Reduced effectiveness after repeated injections is sometimes seen and rarely infection at the injection site may occur. All care will be taken to prevent these side effects. If therapy is given over a long time, atrophy and wasting in the muscle injected may occur. Occasionally the patient's become refractory to treatment because they develop antibodies to the toxin. In this event, therapy needs to be modified.  I have read the above information and consent to the administration of botulism toxin.    BOTOX PROCEDURE NOTE FOR MIGRAINE HEADACHE    Contraindications and precautions discussed with patient(above). Aseptic procedure was observed and patient tolerated procedure. Procedure performed by Debbora Presto, FNP-C.   The condition has existed for more than 6 months, and pt does not have a diagnosis of ALS, Myasthenia Gravis or Lambert-Eaton Syndrome.  Risks and benefits of injections discussed and pt agrees to proceed with the procedure.  Written consent obtained  These injections are medically necessary. Pt  receives good benefits from these injections. These injections do not cause sedations or hallucinations which the oral therapies may cause.  Description of procedure:  The patient was placed in a sitting position. The standard protocol was used for Botox as follows, with 5 units of Botox injected at each site:   -Procerus muscle, midline injection  -Corrugator muscle, bilateral injection  -Frontalis muscle, bilateral injection, with 2 sites each side, medial injection was performed in the upper one third of the frontalis muscle, in the region vertical from the medial inferior edge of the superior orbital rim. The lateral injection was again in the upper one third of the forehead vertically above  the lateral limbus of the cornea, 1.5 cm lateral to the medial injection site.  -Temporalis muscle injection, 5 sites, bilaterally. The first injection was 3 cm above the tragus of the ear, second injection site was 1.5 cm to 3 cm up from the first injection site in line with the tragus of the ear. The third injection site was 1.5-3 cm forward between the first 2 injection sites. The fourth injection site was 1.5 cm posterior to the second injection site. 5th site laterally in the temporalis  muscleat the level of the outer canthus.  -Occipitalis muscle injection, 3 sites, bilaterally. The first injection was done one half way between the occipital protuberance and the tip of the mastoid process behind the ear. The second injection site was done lateral and superior to the first, 1 fingerbreadth from the first injection. The third injection site was 1 fingerbreadth superiorly and medially from the first injection site.  -Cervical paraspinal muscle injection, 2 sites, bilateral knee first injection site was 1 cm from the midline of the cervical spine, 3 cm inferior to the lower border of the occipital protuberance. The second injection site was 1.5 cm superiorly and laterally to the first injection site.  -Trapezius muscle injection was performed at 3 sites, bilaterally. The first injection site was in the upper trapezius muscle halfway between the inflection point of the neck, and the acromion. The second injection site  was one half way between the acromion and the first injection site. The third injection was done between the first injection site and the inflection point of the neck.   Will return for repeat injection in 3 months.   A 200 units of Botox prepared, 155 unites injected and 45 units of Botox was wasted. The patient tolerated the procedure well, there were no complications of the above procedure.

## 2022-12-12 ENCOUNTER — Ambulatory Visit: Payer: Medicaid Other | Admitting: Family Medicine

## 2022-12-12 ENCOUNTER — Ambulatory Visit: Payer: Medicaid Other | Admitting: Neurology

## 2022-12-12 DIAGNOSIS — G43709 Chronic migraine without aura, not intractable, without status migrainosus: Secondary | ICD-10-CM

## 2022-12-12 MED ORDER — ONABOTULINUMTOXINA 200 UNITS IJ SOLR
155.0000 [IU] | Freq: Once | INTRAMUSCULAR | Status: AC
  Administered 2022-12-12: 155 [IU] via INTRAMUSCULAR

## 2022-12-12 NOTE — Progress Notes (Signed)
Botox- 200 units x 1 vial Lot: [id number] Expiration: 02/2025 NDC: [id number]  Bacteriostatic 0.9% Sodium Chloride- 4 mL total Lot: [id number] Expiration: 07/2024 NDC: [id number]  Dx: [id number] B/B Witnessed by Newman Pies

## 2022-12-12 NOTE — Progress Notes (Signed)
Consent Form Botulism Toxin Injection For Chronic Migraine  12/12/2022: Stable 09/19/2022 ALL: Kelly Marsh returns for Botox. She continues to do well. Migraines are well managed.   06/19/2022 ALL: Kelly Marsh returns for Botox. She reports improvement over the past 12 weeks. She may have 3-4 migraines a month. Eletriptan works well for abortive therapy. She reports BP is usually 120-130/80's. Today's reading is not typical.   03/27/2022 ALL: Kelly Marsh returns for Botox. Last procedure 09/2021. She reports near daily headaches since missing last appt in April. Most are migrainous. She continues eletriptan for abortive therapy.   10/05/2020 ALL: She returns for Botox. She has had more migraines, recently, but doing well. Eletriptan works for abortive therapy.   07/06/2021 ALL: She continues to do well on Botox. 4-5 migraine days per month. Eletriptan works well. She has a migraine today but was scared to take abortive meds. She will take when she gets home.   04/04/2021 ALL: She is doing well, today. She continues to have about 4-8 migraine days each month. Eletriptan was effective for abortive therapy. She has decided to continue Botox and does not wish to pursue [facility] referral at this time.   12/01/2020 ALL: She continues Botox therapy. She feels it helps some but continues to have 4-8 migraines per month. She has tried and failed multiple prevention and abortive meds. She is not taking anything for abortive therapy. Previously tried sumatriptan, rizatriptan, frovatriptan, naproxen, toradol, Ubrelvy. She has not heard back from [facility] and was encouraged to call to schedule appt. I will call in eletriptan to see if this helps. Appropriate administration discussed with patient.   08/30/2020 ALL: She was referred to [facility] for intractable migraines, not relieved with abortive therapies. She has about 4-5 migraines per month. She does not use abortive therapies as they are ineffective. Botox seems to be helping. She is trying  to avoid triggers.   Interval history 05/13/2020 AA: has a tooth infection and getting more migraines since then but still she has done exceptionally well with this treatment. >50% reduction in frequency of migraines and headaches since starting botox. Patient has tried multiple acute meds.Clenching is a problem. She feels as though she still has severe headaches, nothing we have given her has helped with acute management and the migraines she still gets are severe we will refer to [place].   [address]: [place]  Reviewed orally with patient, additionally signature is on file:  Botulism toxin has been approved by the Federal drug administration for treatment of chronic migraine. Botulism toxin does not cure chronic migraine and it may not be effective in some patients.  The administration of botulism toxin is accomplished by injecting a small amount of toxin into the muscles of the neck and head. Dosage must be titrated for each individual. Any benefits resulting from botulism toxin tend to wear off after 3 months with a repeat injection required if benefit is to be maintained. Injections are usually done every 3-4 months with maximum effect peak achieved by about 2 or 3 weeks. Botulism toxin is expensive and you should be sure of what costs you will incur resulting from the injection.  The side effects of botulism toxin use for chronic migraine may include:   -Transient, and usually mild, facial weakness with facial injections  -Transient, and usually mild, head or neck weakness with head/neck injections  -Reduction or loss of forehead facial animation due to forehead muscle weakness  -Eyelid drooping  -Dry eye  -Pain at  the site of injection or bruising at the site of injection  -Double vision  -Potential unknown long term risks  Contraindications: You should not have Botox if you are pregnant, nursing, allergic to albumin, have an infection, skin condition, or muscle  weakness at the site of the injection, or have myasthenia gravis, Lambert-Eaton syndrome, or ALS.  It is also possible that as with any injection, there may be an allergic reaction or no effect from the medication. Reduced effectiveness after repeated injections is sometimes seen and rarely infection at the injection site may occur. All care will be taken to prevent these side effects. If therapy is given over a long time, atrophy and wasting in the muscle injected may occur. Occasionally the patient's become refractory to treatment because they develop antibodies to the toxin. In this event, therapy needs to be modified.  I have read the above information and consent to the administration of botulism toxin.    BOTOX PROCEDURE NOTE FOR MIGRAINE HEADACHE    Contraindications and precautions discussed with patient(above). Aseptic procedure was observed and patient tolerated procedure. Procedure performed by Debbora Presto, FNP-C.   The condition has existed for more than 6 months, and pt does not have a diagnosis of ALS, Myasthenia Gravis or Lambert-Eaton Syndrome.  Risks and benefits of injections discussed and pt agrees to proceed with the procedure.  Written consent obtained  These injections are medically necessary. Pt  receives good benefits from these injections. These injections do not cause sedations or hallucinations which the oral therapies may cause.  Description of procedure:  The patient was placed in a sitting position. The standard protocol was used for Botox as follows, with 5 units of Botox injected at each site:   -Procerus muscle, midline injection  -Corrugator muscle, bilateral injection  -Frontalis muscle, bilateral injection, with 2 sites each side, medial injection was performed in the upper one third of the frontalis muscle, in the region vertical from the medial inferior edge of the superior orbital rim. The lateral injection was again in the upper one third of the  forehead vertically above the lateral limbus of the cornea, 1.5 cm lateral to the medial injection site.  -Temporalis muscle injection, 5 sites, bilaterally. The first injection was 3 cm above the tragus of the ear, second injection site was 1.5 cm to 3 cm up from the first injection site in line with the tragus of the ear. The third injection site was 1.5-3 cm forward between the first 2 injection sites. The fourth injection site was 1.5 cm posterior to the second injection site. 5th site laterally in the temporalis  muscleat the level of the outer canthus.  -Occipitalis muscle injection, 3 sites, bilaterally. The first injection was done one half way between the occipital protuberance and the tip of the mastoid process behind the ear. The second injection site was done lateral and superior to the first, 1 fingerbreadth from the first injection. The third injection site was 1 fingerbreadth superiorly and medially from the first injection site.  -Cervical paraspinal muscle injection, 2 sites, bilateral knee first injection site was 1 cm from the midline of the cervical spine, 3 cm inferior to the lower border of the occipital protuberance. The second injection site was 1.5 cm superiorly and laterally to the first injection site.  -Trapezius muscle injection was performed at 3 sites, bilaterally. The first injection site was in the upper trapezius muscle halfway between the inflection point of the neck, and the acromion. The second  injection site was one half way between the acromion and the first injection site. The third injection was done between the first injection site and the inflection point of the neck.   Will return for repeat injection in 3 months.   A 200 units of Botox prepared, 155 unites injected and 45 units of Botox was wasted. The patient tolerated the procedure well, there were no complications of the above procedure.

## 2023-01-29 ENCOUNTER — Ambulatory Visit: Payer: Medicaid Other | Admitting: Neurology

## 2023-02-04 ENCOUNTER — Other Ambulatory Visit: Payer: Self-pay

## 2023-02-04 ENCOUNTER — Encounter (HOSPITAL_BASED_OUTPATIENT_CLINIC_OR_DEPARTMENT_OTHER): Payer: Self-pay

## 2023-02-04 DIAGNOSIS — R1013 Epigastric pain: Secondary | ICD-10-CM | POA: Diagnosis present

## 2023-02-04 DIAGNOSIS — K2100 Gastro-esophageal reflux disease with esophagitis, without bleeding: Secondary | ICD-10-CM | POA: Insufficient documentation

## 2023-02-04 NOTE — ED Triage Notes (Signed)
Pt with abd pain that feels like a cramp that comes and goes. Pt is nauseous. Pt reports she had an esophageal dilatation on Friday 5/17. Pt also endorses dizziness. Received 4mg  Zofran with EMS.

## 2023-02-05 ENCOUNTER — Emergency Department (HOSPITAL_BASED_OUTPATIENT_CLINIC_OR_DEPARTMENT_OTHER)
Admission: EM | Admit: 2023-02-05 | Discharge: 2023-02-05 | Disposition: A | Discharge status: Home / Self Care | Payer: Medicaid Other | Attending: Emergency Medicine | Admitting: Emergency Medicine

## 2023-02-05 ENCOUNTER — Emergency Department (HOSPITAL_BASED_OUTPATIENT_CLINIC_OR_DEPARTMENT_OTHER): Payer: Medicaid Other

## 2023-02-05 ENCOUNTER — Encounter (HOSPITAL_BASED_OUTPATIENT_CLINIC_OR_DEPARTMENT_OTHER): Payer: Self-pay

## 2023-02-05 DIAGNOSIS — R1013 Epigastric pain: Secondary | ICD-10-CM

## 2023-02-05 DIAGNOSIS — K219 Gastro-esophageal reflux disease without esophagitis: Secondary | ICD-10-CM

## 2023-02-05 LAB — URINALYSIS, MICROSCOPIC (REFLEX)

## 2023-02-05 LAB — CBC
HCT: 41.1 % (ref 36.0–46.0)
Hemoglobin: 13.6 g/dL (ref 12.0–15.0)
MCH: 29.4 pg (ref 26.0–34.0)
MCHC: 33.1 g/dL (ref 30.0–36.0)
MCV: 88.8 fL (ref 80.0–100.0)
Platelets: 380 10*3/uL (ref 150–400)
RBC: 4.63 MIL/uL (ref 3.87–5.11)
RDW: 12.4 % (ref 11.5–15.5)
WBC: 9.7 10*3/uL (ref 4.0–10.5)
nRBC: 0 % (ref 0.0–0.2)

## 2023-02-05 LAB — URINALYSIS, ROUTINE W REFLEX MICROSCOPIC
Bilirubin Urine: NEGATIVE
Glucose, UA: NEGATIVE mg/dL
Ketones, ur: 15 mg/dL — AB
Leukocytes,Ua: NEGATIVE
Nitrite: NEGATIVE
Protein, ur: 30 mg/dL — AB
Specific Gravity, Urine: >=1.03 (ref 1.005–1.030)
pH: 6.5 (ref 5.0–8.0)

## 2023-02-05 LAB — COMPREHENSIVE METABOLIC PANEL
ALT: 18 U/L (ref 0–44)
AST: 20 U/L (ref 15–41)
Albumin: 3.8 g/dL (ref 3.5–5.0)
Alkaline Phosphatase: 93 U/L (ref 38–126)
Anion gap: 9 (ref 5–15)
BUN: 16 mg/dL (ref 6–20)
CO2: 23 mmol/L (ref 22–32)
Calcium: 8.5 mg/dL — ABNORMAL LOW (ref 8.9–10.3)
Chloride: 108 mmol/L (ref 98–111)
Creatinine, Ser: 0.68 mg/dL (ref 0.44–1.00)
GFR, Estimated: >60 mL/min (ref >60)
Glucose, Bld: 155 mg/dL — ABNORMAL HIGH (ref 70–99)
Potassium: 3.6 mmol/L (ref 3.5–5.1)
Sodium: 140 mmol/L (ref 135–145)
Total Bilirubin: 0.5 mg/dL (ref 0.3–1.2)
Total Protein: 7 g/dL (ref 6.5–8.1)

## 2023-02-05 LAB — LIPASE, BLOOD: Lipase: 36 U/L (ref 11–51)

## 2023-02-05 LAB — PREGNANCY, URINE: Preg Test, Ur: NEGATIVE

## 2023-02-05 MED ORDER — IOHEXOL 300 MG/ML  SOLN
100.0000 mL | Freq: Once | INTRAMUSCULAR | Status: AC | PRN
  Administered 2023-02-05: 100 mL via INTRAVENOUS

## 2023-02-05 MED ORDER — FAMOTIDINE 20 MG PO TABS: 20.0000 mg | ORAL_TABLET | Freq: Two times a day (BID) | ORAL | 0 refills | Status: AC

## 2023-02-05 MED ORDER — ALUM & MAG HYDROXIDE-SIMETH 200-200-20 MG/5ML PO SUSP
30.0000 mL | Freq: Once | ORAL | Status: AC
  Administered 2023-02-05: 30 mL via ORAL
  Filled 2023-02-05: qty 30

## 2023-02-05 MED ORDER — DROPERIDOL 2.5 MG/ML IJ SOLN
1.2500 mg | Freq: Once | INTRAMUSCULAR | Status: AC
  Administered 2023-02-05: 1.25 mg via INTRAVENOUS
  Filled 2023-02-05: qty 2

## 2023-02-05 MED ORDER — HYOSCYAMINE SULFATE 0.125 MG SL SUBL
0.2500 mg | SUBLINGUAL_TABLET | Freq: Once | SUBLINGUAL | Status: AC
  Administered 2023-02-05: 0.25 mg via SUBLINGUAL
  Filled 2023-02-05: qty 2

## 2023-02-05 NOTE — ED Provider Notes (Signed)
[place] [facility] Provider Note   CSN: [id number] Arrival date & time: 02/04/23  2332     History  Chief Complaint  Patient presents with   Abdominal Pain    Kelly Marsh is a 47 y.o. female.  The history is provided by the patient.  Abdominal Pain Pain location: upper abdomen after eating chicken with skin bread and tortilla.  Has inflammation on the stomach on EGD this week.  Only start GERD medicine today does not know name, Pain quality: cramping   Pain radiates to:  Does not radiate Pain severity:  Moderate Onset quality:  Sudden Timing:  Constant Progression:  Unchanged Chronicity:  New Context: eating   Relieved by:  Nothing Worsened by:  Nothing Ineffective treatments:  None tried Associated symptoms: no chest pain, no chills, no constipation, no cough, no fever, no flatus and no vomiting   Risk factors: no alcohol abuse and no aspirin use       Past Medical History:  Diagnosis Date   Fatigue    Migraine    Snoring    Vitamin B12 deficiency    Vitamin D deficiency      Home Medications Prior to Admission medications   Medication Sig Start Date End Date Taking? Authorizing Provider  botulinum toxin Type A (BOTOX) 100 units SOLR injection PROVIDER TO INJECT 155 UNITS INTO THE MUSCLES OF THE HEAD AND NECK EVERY 3 MONTHS. DISCARD REMAINDER. 05/04/20   Anson Fret, MD  Cholecalciferol (VITAMIN D3) 1.25 MG (50000 UT) CAPS Take 1 capsule by mouth. Every other week    [provider]  desogestrel-ethinyl estradiol (VELIVET) 0.1/0.125/0.15 -0.025 MG tablet Take 1 tablet by mouth daily.    [provider]  eletriptan (RELPAX) 40 MG tablet Take 1 tablet (40 mg total) by mouth as needed for migraine or headache. May repeat in 2 hours if headache persists or recurs. 04/05/21   Shawnie Dapper, NP      Allergies    Patient has no known allergies.    Review of Systems   Review of Systems  Constitutional:   Negative for chills and fever.  Respiratory:  Negative for cough.   Cardiovascular:  Negative for chest pain.  Gastrointestinal:  Positive for abdominal pain. Negative for constipation, flatus and vomiting.  All other systems reviewed and are negative.   Physical Exam Updated Vital Signs BP (!) 133/94 (BP Location: Right Arm)   Pulse 83   Temp 98.2 F (36.8 C) (Oral)   Resp 16   Ht 5\' 5"  (1.651 m)   Wt 106.6 kg   LMP 01/21/2023 (Exact Date)   SpO2 95%   BMI 39.11 kg/m  Physical Exam Vitals and nursing note reviewed.  Constitutional:      General: She is not in acute distress.    Appearance: Normal appearance. She is well-developed.  HENT:     Head: Normocephalic and atraumatic.     Nose: Nose normal.  Eyes:     Pupils: Pupils are equal, round, and reactive to light.  Cardiovascular:     Rate and Rhythm: Normal rate and regular rhythm.     Pulses: Normal pulses.     Heart sounds: Normal heart sounds.  Pulmonary:     Effort: Pulmonary effort is normal. No respiratory distress.     Breath sounds: Normal breath sounds.  Abdominal:     General: Bowel sounds are normal. There is no distension.     Palpations: Abdomen is  soft.     Tenderness: There is no abdominal tenderness. There is no guarding or rebound.  Genitourinary:    Vagina: No vaginal discharge.  Musculoskeletal:        General: Normal range of motion.     Cervical back: Neck supple.  Skin:    General: Skin is warm and dry.     Capillary Refill: Capillary refill takes less than 2 seconds.     Findings: No erythema or rash.  Neurological:     General: No focal deficit present.     Mental Status: She is alert and oriented to person, place, and time.     Deep Tendon Reflexes: Reflexes normal.  Psychiatric:        Mood and Affect: Mood normal.     ED Results / Procedures / Treatments   Labs (all labs ordered are listed, but only abnormal results are displayed) Results for orders placed or performed during  the hospital encounter of 02/05/23  Lipase, blood  Result Value Ref Range   Lipase 36 11 - 51 U/L  Comprehensive metabolic panel  Result Value Ref Range   Sodium 140 135 - 145 mmol/L   Potassium 3.6 3.5 - 5.1 mmol/L   Chloride 108 98 - 111 mmol/L   CO2 23 22 - 32 mmol/L   Glucose, Bld 155 (H) 70 - 99 mg/dL   BUN 16 6 - 20 mg/dL   Creatinine, Ser 6.57 0.44 - 1.00 mg/dL   Calcium 8.5 (L) 8.9 - 10.3 mg/dL   Total Protein 7.0 6.5 - 8.1 g/dL   Albumin 3.8 3.5 - 5.0 g/dL   AST 20 15 - 41 U/L   ALT 18 0 - 44 U/L   Alkaline Phosphatase 93 38 - 126 U/L   Total Bilirubin 0.5 0.3 - 1.2 mg/dL   GFR, Estimated >84 >69 mL/min   Anion gap 9 5 - 15  CBC  Result Value Ref Range   WBC 9.7 4.0 - 10.5 K/uL   RBC 4.63 3.87 - 5.11 MIL/uL   Hemoglobin 13.6 12.0 - 15.0 g/dL   HCT 62.9 52.8 - 41.3 %   MCV 88.8 80.0 - 100.0 fL   MCH 29.4 26.0 - 34.0 pg   MCHC 33.1 30.0 - 36.0 g/dL   RDW 24.4 01.0 - 27.2 %   Platelets 380 150 - 400 K/uL   nRBC 0.0 0.0 - 0.2 %  Urinalysis, Routine w reflex microscopic -Urine, Clean Catch  Result Value Ref Range   Color, Urine YELLOW YELLOW   APPearance HAZY (A) CLEAR   Specific Gravity, Urine >=1.030 1.005 - 1.030   pH 6.5 5.0 - 8.0   Glucose, UA NEGATIVE NEGATIVE mg/dL   Hgb urine dipstick TRACE (A) NEGATIVE   Bilirubin Urine NEGATIVE NEGATIVE   Ketones, ur 15 (A) NEGATIVE mg/dL   Protein, ur 30 (A) NEGATIVE mg/dL   Nitrite NEGATIVE NEGATIVE   Leukocytes,Ua NEGATIVE NEGATIVE  Pregnancy, urine  Result Value Ref Range   Preg Test, Ur NEGATIVE NEGATIVE  Urinalysis, Microscopic (reflex)  Result Value Ref Range   RBC / HPF 0-5 0 - 5 RBC/hpf   WBC, UA 6-10 0 - 5 WBC/hpf   Bacteria, UA RARE (A) NONE SEEN   Squamous Epithelial / HPF 0-5 0 - 5 /HPF   Mucus PRESENT    Hyaline Casts, UA PRESENT    Granular Casts, UA PRESENT    Sperm, UA PRESENT    CT CHEST ABDOMEN PELVIS W CONTRAST  Result  Date: 02/05/2023 CLINICAL DATA:  Cramping abdominal pain. Nausea.  Patient had esophageal dilation on Friday 5/17. Dizziness. EXAM: CT CHEST, ABDOMEN, AND PELVIS WITH CONTRAST TECHNIQUE: Multidetector CT imaging of the chest, abdomen and pelvis was performed following the standard protocol during bolus administration of intravenous contrast. RADIATION DOSE REDUCTION: This exam was performed according to the departmental dose-optimization program which includes automated exposure control, adjustment of the mA and/or kV according to patient size and/or use of iterative reconstruction technique. CONTRAST:  OMNIPAQUE IOHEXOL 300 MG/ML  SOLN COMPARISON:  Chest radiographs 06/25/2022 FINDINGS: CT CHEST FINDINGS Cardiovascular: No pericardial effusion. No aortic aneurysm or dissection. No central pulmonary embolism. Mediastinum/Nodes: Small hiatal hernia.  No thoracic adenopathy. Lungs/Pleura: No focal consolidation, pleural effusion, or pneumothorax. Central airways are patent. Musculoskeletal: No acute osseous abnormality. CT ABDOMEN PELVIS FINDINGS Hepatobiliary: Hepatic steatosis. Decompressed gallbladder. No biliary dilation. Pancreas: Unremarkable. Spleen: Unremarkable. Adrenals/Urinary Tract: Normal adrenal glands. Low-attenuation lesions in the kidneys are statistically likely to represent cysts. No follow-up is required. Nonobstructing left nephrolithiasis. No hydronephrosis. Unremarkable bladder. Stomach/Bowel: Normal caliber large and small bowel. No bowel wall thickening. Normal appendix. Stomach is within normal limits. Vascular/Lymphatic: No significant vascular findings are present. No enlarged abdominal or pelvic lymph nodes. Reproductive: Uterus and bilateral adnexa are unremarkable. Other: No free intraperitoneal fluid or air. Musculoskeletal: No acute osseous abnormality. IMPRESSION: 1. No acute findings in the chest, abdomen, or pelvis. 2. Small hiatal hernia. 3. Hepatic steatosis. 4. Nonobstructing left nephrolithiasis. Electronically Signed   By: Minerva Fester M.D.   On: 02/05/2023 01:46    EKG EKG Interpretation  Date/Time:  Sunday Feb 04 2023 23:44:10 EDT Ventricular Rate:  82 PR Interval:  139 QRS Duration: 84 QT Interval:  363 QTC Calculation: 424 R Axis:   31 Text Interpretation: Sinus rhythm Confirmed by Daegen Berrocal ([id number]) on 02/05/2023 12:51:48 AM  Radiology CT CHEST ABDOMEN PELVIS W CONTRAST  Result Date: 02/05/2023 CLINICAL DATA:  Cramping abdominal pain. Nausea. Patient had esophageal dilation on Friday 5/17. Dizziness. EXAM: CT CHEST, ABDOMEN, AND PELVIS WITH CONTRAST TECHNIQUE: Multidetector CT imaging of the chest, abdomen and pelvis was performed following the standard protocol during bolus administration of intravenous contrast. RADIATION DOSE REDUCTION: This exam was performed according to the departmental dose-optimization program which includes automated exposure control, adjustment of the mA and/or kV according to patient size and/or use of iterative reconstruction technique. CONTRAST:  OMNIPAQUE IOHEXOL 300 MG/ML  SOLN COMPARISON:  Chest radiographs 06/25/2022 FINDINGS: CT CHEST FINDINGS Cardiovascular: No pericardial effusion. No aortic aneurysm or dissection. No central pulmonary embolism. Mediastinum/Nodes: Small hiatal hernia.  No thoracic adenopathy. Lungs/Pleura: No focal consolidation, pleural effusion, or pneumothorax. Central airways are patent. Musculoskeletal: No acute osseous abnormality. CT ABDOMEN PELVIS FINDINGS Hepatobiliary: Hepatic steatosis. Decompressed gallbladder. No biliary dilation. Pancreas: Unremarkable. Spleen: Unremarkable. Adrenals/Urinary Tract: Normal adrenal glands. Low-attenuation lesions in the kidneys are statistically likely to represent cysts. No follow-up is required. Nonobstructing left nephrolithiasis. No hydronephrosis. Unremarkable bladder. Stomach/Bowel: Normal caliber large and small bowel. No bowel wall thickening. Normal appendix. Stomach is within normal limits.  Vascular/Lymphatic: No significant vascular findings are present. No enlarged abdominal or pelvic lymph nodes. Reproductive: Uterus and bilateral adnexa are unremarkable. Other: No free intraperitoneal fluid or air. Musculoskeletal: No acute osseous abnormality. IMPRESSION: 1. No acute findings in the chest, abdomen, or pelvis. 2. Small hiatal hernia. 3. Hepatic steatosis. 4. Nonobstructing left nephrolithiasis. Electronically Signed   By: Minerva Fester M.D.   On: 02/05/2023 01:46  Procedures Procedures    Medications Ordered in ED Medications  droperidol (INAPSINE) 2.5 MG/ML injection 1.25 mg (1.25 mg Intravenous Given 02/05/23 0118)  iohexol (OMNIPAQUE) 300 MG/ML solution 100 mL (100 mLs Intravenous Contrast Given 02/05/23 0119)  alum & mag hydroxide-simeth (MAALOX/MYLANTA) 200-200-20 MG/5ML suspension 30 mL (30 mLs Oral Given 02/05/23 0205)  hyoscyamine (LEVSIN SL) SL tablet 0.25 mg (0.25 mg Sublingual Given 02/05/23 0205)    ED Course/ Medical Decision Making/ A&P                             Medical Decision Making Patient with upper abdominal pain post EGD  Amount and/or Complexity of Data Reviewed Independent Historian: spouse    Details: See above  External Data Reviewed: notes.    Details: Previous notes reviewed  Labs: ordered.    Details: Preg is negative urine is negative.  Lipase normal 36.  Normal sodium 140, normal potassium 3.6, normal creatinine .68 normal LFTS Radiology: ordered and independent interpretation performed.    Details: CT normal no perforations.    Risk OTC drugs. Prescription drug management. Risk Details: Symptoms are consistent with GERD.  I have advised GERD friendly diet and continuing to take GERD medications.  It is likely a PPI and will take 7 days to be active.  Well appearing with normal exam, labs and imaging.  Stable for discharge with close follow up with GI    Final Clinical Impression(s) / ED Diagnoses Final diagnoses:  None    Return for intractable cough, coughing up blood, fevers > 100.4 unrelieved by medication, shortness of breath, intractable vomiting, chest pain, shortness of breath, weakness, numbness, changes in speech, facial asymmetry, abdominal pain, passing out, Inability to tolerate liquids or food, cough, altered mental status or any concerns. No signs of systemic illness or infection. The patient is nontoxic-appearing on exam and vital signs are within normal limits.  I have reviewed the triage vital signs and the nursing notes. Pertinent labs & imaging results that were available during my care of the patient were reviewed by me and considered in my medical decision making (see chart for details). After history, exam, and medical workup I feel the patient has been appropriately medically screened and is safe for discharge home. Pertinent diagnoses were discussed with the patient. Patient was given return precautions.  Rx / DC Orders ED Discharge Orders     None         Rudine Rieger, MD 02/05/23 1610

## 2023-02-06 ENCOUNTER — Telehealth: Payer: Self-pay | Admitting: Family Medicine

## 2023-02-06 NOTE — Telephone Encounter (Signed)
PA not required for [id number] or [id number] per [facility] reps [employer]. Ref #s for call are [phone] & [phone].

## 2023-03-06 NOTE — Progress Notes (Unsigned)
Consent Form Botulism Toxin Injection For Chronic Migraine  03/08/2023 ALL: Kelly Marsh returns for Botox. She continues to do well. She may have 4-5 migraines per month. Eletriptan helps most of the time. She rarely needs to repeat dosing. I have advised her to try taking Eletriptan with a dose of Excedrin to see if this works better for her.   09/19/2022 ALL: Kelly Marsh returns for Botox. She continues to do well. Migraines are well managed.   06/19/2022 ALL: Kelly Marsh returns for Botox. She reports improvement over the past 12 weeks. She may have 3-4 migraines a month. Eletriptan works well for abortive therapy. She reports BP is usually 120-130/80's. Today's reading is not typical.   03/27/2022 ALL: Kelly Marsh returns for Botox. Last procedure 09/2021. She reports near daily headaches since missing last appt in April. Most are migrainous. She continues eletriptan for abortive therapy.   10/05/2020 ALL: She returns for Botox. She has had more migraines, recently, but doing well. Eletriptan works for abortive therapy.   07/06/2021 ALL: She continues to do well on Botox. 4-5 migraine days per month. Eletriptan works well. She has a migraine today but was scared to take abortive meds. She will take when she gets home.   04/04/2021 ALL: She is doing well, today. She continues to have about 4-8 migraine days each month. Eletriptan was effective for abortive therapy. She has decided to continue Botox and does not wish to pursue [facility] referral at this time.   12/01/2020 ALL: She continues Botox therapy. She feels it helps some but continues to have 4-8 migraines per month. She has tried and failed multiple prevention and abortive meds. She is not taking anything for abortive therapy. Previously tried sumatriptan, rizatriptan, frovatriptan, naproxen, toradol, Ubrelvy. She has not heard back from [facility] and was encouraged to call to schedule appt. I will call in eletriptan to see if this helps. Appropriate administration discussed  with patient.   08/30/2020 ALL: She was referred to [facility] for intractable migraines, not relieved with abortive therapies. She has about 4-5 migraines per month. She does not use abortive therapies as they are ineffective. Botox seems to be helping. She is trying to avoid triggers.   Interval history 05/13/2020 AA: has a tooth infection and getting more migraines since then but still she has done exceptionally well with this treatment. >50% reduction in frequency of migraines and headaches since starting botox. Patient has tried multiple acute meds.Clenching is a problem. She feels as though she still has severe headaches, nothing we have given her has helped with acute management and the migraines she still gets are severe we will refer to [facility].   [address]: [place]  Reviewed orally with patient, additionally signature is on file:  Botulism toxin has been approved by the Federal drug administration for treatment of chronic migraine. Botulism toxin does not cure chronic migraine and it may not be effective in some patients.  The administration of botulism toxin is accomplished by injecting a small amount of toxin into the muscles of the neck and head. Dosage must be titrated for each individual. Any benefits resulting from botulism toxin tend to wear off after 3 months with a repeat injection required if benefit is to be maintained. Injections are usually done every 3-4 months with maximum effect peak achieved by about 2 or 3 weeks. Botulism toxin is expensive and you should be sure of what costs you will incur resulting from the injection.  The side effects of botulism toxin  use for chronic migraine may include:   -Transient, and usually mild, facial weakness with facial injections  -Transient, and usually mild, head or neck weakness with head/neck injections  -Reduction or loss of forehead facial animation due to forehead muscle weakness  -Eyelid drooping  -Dry  eye  -Pain at the site of injection or bruising at the site of injection  -Double vision  -Potential unknown long term risks  Contraindications: You should not have Botox if you are pregnant, nursing, allergic to albumin, have an infection, skin condition, or muscle weakness at the site of the injection, or have myasthenia gravis, Lambert-Eaton syndrome, or ALS.  It is also possible that as with any injection, there may be an allergic reaction or no effect from the medication. Reduced effectiveness after repeated injections is sometimes seen and rarely infection at the injection site may occur. All care will be taken to prevent these side effects. If therapy is given over a long time, atrophy and wasting in the muscle injected may occur. Occasionally the patient's become refractory to treatment because they develop antibodies to the toxin. In this event, therapy needs to be modified.  I have read the above information and consent to the administration of botulism toxin.    BOTOX PROCEDURE NOTE FOR MIGRAINE HEADACHE    Contraindications and precautions discussed with patient(above). Aseptic procedure was observed and patient tolerated procedure. Procedure performed by Shawnie Dapper, FNP-C.   The condition has existed for more than 6 months, and pt does not have a diagnosis of ALS, Myasthenia Gravis or Lambert-Eaton Syndrome.  Risks and benefits of injections discussed and pt agrees to proceed with the procedure.  Written consent obtained  These injections are medically necessary. Pt  receives good benefits from these injections. These injections do not cause sedations or hallucinations which the oral therapies may cause.  Description of procedure:  The patient was placed in a sitting position. The standard protocol was used for Botox as follows, with 5 units of Botox injected at each site:   -Procerus muscle, midline injection  -Corrugator muscle, bilateral injection  -Frontalis muscle,  bilateral injection, with 2 sites each side, medial injection was performed in the upper one third of the frontalis muscle, in the region vertical from the medial inferior edge of the superior orbital rim. The lateral injection was again in the upper one third of the forehead vertically above the lateral limbus of the cornea, 1.5 cm lateral to the medial injection site.  -Temporalis muscle injection, 5 sites, bilaterally. The first injection was 3 cm above the tragus of the ear, second injection site was 1.5 cm to 3 cm up from the first injection site in line with the tragus of the ear. The third injection site was 1.5-3 cm forward between the first 2 injection sites. The fourth injection site was 1.5 cm posterior to the second injection site. 5th site laterally in the temporalis  muscleat the level of the outer canthus.  -Occipitalis muscle injection, 3 sites, bilaterally. The first injection was done one half way between the occipital protuberance and the tip of the mastoid process behind the ear. The second injection site was done lateral and superior to the first, 1 fingerbreadth from the first injection. The third injection site was 1 fingerbreadth superiorly and medially from the first injection site.  -Cervical paraspinal muscle injection, 2 sites, bilateral knee first injection site was 1 cm from the midline of the cervical spine, 3 cm inferior to the lower border of  the occipital protuberance. The second injection site was 1.5 cm superiorly and laterally to the first injection site.  -Trapezius muscle injection was performed at 3 sites, bilaterally. The first injection site was in the upper trapezius muscle halfway between the inflection point of the neck, and the acromion. The second injection site was one half way between the acromion and the first injection site. The third injection was done between the first injection site and the inflection point of the neck.   Will return for repeat  injection in 3 months.   A 200 units of Botox prepared, 155 unites injected and 45 units of Botox was wasted. The patient tolerated the procedure well, there were no complications of the above procedure.

## 2023-03-07 ENCOUNTER — Ambulatory Visit: Payer: Medicaid Other | Admitting: Neurology

## 2023-03-08 ENCOUNTER — Ambulatory Visit: Payer: Medicaid Other | Admitting: Family Medicine

## 2023-03-08 DIAGNOSIS — G43709 Chronic migraine without aura, not intractable, without status migrainosus: Secondary | ICD-10-CM | POA: Diagnosis not present

## 2023-03-08 MED ORDER — ONABOTULINUMTOXINA 200 UNITS IJ SOLR
155.0000 [IU] | Freq: Once | INTRAMUSCULAR | Status: AC
Start: 2023-03-08 — End: 2023-03-08
  Administered 2023-03-08: 155 [IU] via INTRAMUSCULAR

## 2023-03-08 NOTE — Progress Notes (Signed)
Botox- 200 units x 1 vial Lot: [id number] Expiration: 04/2025 NDC: 2952-8413-24  Bacteriostatic 0.9% Sodium Chloride- * mL  Lot: [id number] Expiration: 12/18/2023 NDC: 2536-6440-34  Dx: V42.595 B/B Witnessed by Marcelina Morel, RN and Ted Mcalpine, RMA

## 2023-03-21 ENCOUNTER — Telehealth: Payer: Medicaid Other | Admitting: Neurology

## 2023-03-21 ENCOUNTER — Telehealth: Payer: Self-pay | Admitting: *Deleted

## 2023-03-21 NOTE — Telephone Encounter (Signed)
I've called the pt twice to ask her to reschedule this morning's video visit per Dr Lucia Gaskins. Earlier I LVM asking if pt is ok with 12 pm, 4 pm for video visit. I just called her again and LVM offering 4 pm. I asked for a call back and apologized on behalf of Dr Lucia Gaskins.

## 2023-05-31 ENCOUNTER — Ambulatory Visit: Payer: Medicaid Other | Admitting: Family Medicine

## 2023-06-05 ENCOUNTER — Telehealth: Payer: Self-pay | Admitting: Neurology

## 2023-06-05 NOTE — Telephone Encounter (Signed)
Pt called to cx 9/18 appt due to conflict. She states she needs a 7:30 am appt, am I ok to add her in at 7:30 on a Thursday before EMGs?

## 2023-06-05 NOTE — Telephone Encounter (Signed)
My new schedule will not accommodate 730s starting next month. Ca you let her know and see when else she can come in M-F? Ty!

## 2023-06-06 ENCOUNTER — Ambulatory Visit: Payer: Medicaid Other | Admitting: Neurology

## 2023-06-18 NOTE — Progress Notes (Deleted)
Consent Form Botulism Toxin Injection For Chronic Migraine  06/21/2023 ALL: Kelly Marsh returns for Botox.   03/08/2023 ALL: Kelly Marsh returns for Botox. She continues to do well. She may have 4-5 migraines per month. Eletriptan helps most of the time. She rarely needs to repeat dosing. I have advised her to try taking Eletriptan with a dose of Excedrin to see if this works better for her.   09/19/2022 ALL: Kelly Marsh returns for Botox. She continues to do well. Migraines are well managed.   06/19/2022 ALL: Kelly Marsh returns for Botox. She reports improvement over the past 12 weeks. She may have 3-4 migraines a month. Eletriptan works well for abortive therapy. She reports BP is usually 120-130/80's. Today's reading is not typical.   03/27/2022 ALL: Kelly Marsh returns for Botox. Last procedure 09/2021. She reports near daily headaches since missing last appt in April. Most are migrainous. She continues eletriptan for abortive therapy.   10/05/2020 ALL: She returns for Botox. She has had more migraines, recently, but doing well. Eletriptan works for abortive therapy.   07/06/2021 ALL: She continues to do well on Botox. 4-5 migraine days per month. Eletriptan works well. She has a migraine today but was scared to take abortive meds. She will take when she gets home.   04/04/2021 ALL: She is doing well, today. She continues to have about 4-8 migraine days each month. Eletriptan was effective for abortive therapy. She has decided to continue Botox and does not wish to pursue [facility] referral at this time.   12/01/2020 ALL: She continues Botox therapy. She feels it helps some but continues to have 4-8 migraines per month. She has tried and failed multiple prevention and abortive meds. She is not taking anything for abortive therapy. Previously tried sumatriptan, rizatriptan, frovatriptan, naproxen, toradol, Ubrelvy. She has not heard back from [facility] and was encouraged to call to schedule appt. I will call in eletriptan to see if this  helps. Appropriate administration discussed with patient.   08/30/2020 ALL: She was referred to [facility] for intractable migraines, not relieved with abortive therapies. She has about 4-5 migraines per month. She does not use abortive therapies as they are ineffective. Botox seems to be helping. She is trying to avoid triggers.   Interval history 05/13/2020 AA: has a tooth infection and getting more migraines since then but still she has done exceptionally well with this treatment. >50% reduction in frequency of migraines and headaches since starting botox. Patient has tried multiple acute meds.Clenching is a problem. She feels as though she still has severe headaches, nothing we have given her has helped with acute management and the migraines she still gets are severe we will refer to [facility].   [address]: [place]  Reviewed orally with patient, additionally signature is on file:  Botulism toxin has been approved by the Federal drug administration for treatment of chronic migraine. Botulism toxin does not cure chronic migraine and it may not be effective in some patients.  The administration of botulism toxin is accomplished by injecting a small amount of toxin into the muscles of the neck and head. Dosage must be titrated for each individual. Any benefits resulting from botulism toxin tend to wear off after 3 months with a repeat injection required if benefit is to be maintained. Injections are usually done every 3-4 months with maximum effect peak achieved by about 2 or 3 weeks. Botulism toxin is expensive and you should be sure of what costs you will incur resulting from the  injection.  The side effects of botulism toxin use for chronic migraine may include:   -Transient, and usually mild, facial weakness with facial injections  -Transient, and usually mild, head or neck weakness with head/neck injections  -Reduction or loss of forehead facial animation due to forehead muscle  weakness  -Eyelid drooping  -Dry eye  -Pain at the site of injection or bruising at the site of injection  -Double vision  -Potential unknown long term risks  Contraindications: You should not have Botox if you are pregnant, nursing, allergic to albumin, have an infection, skin condition, or muscle weakness at the site of the injection, or have myasthenia gravis, Lambert-Eaton syndrome, or ALS.  It is also possible that as with any injection, there may be an allergic reaction or no effect from the medication. Reduced effectiveness after repeated injections is sometimes seen and rarely infection at the injection site may occur. All care will be taken to prevent these side effects. If therapy is given over a long time, atrophy and wasting in the muscle injected may occur. Occasionally the patient's become refractory to treatment because they develop antibodies to the toxin. In this event, therapy needs to be modified.  I have read the above information and consent to the administration of botulism toxin.    BOTOX PROCEDURE NOTE FOR MIGRAINE HEADACHE    Contraindications and precautions discussed with patient(above). Aseptic procedure was observed and patient tolerated procedure. Procedure performed by Shawnie Dapper, FNP-C.   The condition has existed for more than 6 months, and pt does not have a diagnosis of ALS, Myasthenia Gravis or Lambert-Eaton Syndrome.  Risks and benefits of injections discussed and pt agrees to proceed with the procedure.  Written consent obtained  These injections are medically necessary. Pt  receives good benefits from these injections. These injections do not cause sedations or hallucinations which the oral therapies may cause.  Description of procedure:  The patient was placed in a sitting position. The standard protocol was used for Botox as follows, with 5 units of Botox injected at each site:   -Procerus muscle, midline injection  -Corrugator muscle, bilateral  injection  -Frontalis muscle, bilateral injection, with 2 sites each side, medial injection was performed in the upper one third of the frontalis muscle, in the region vertical from the medial inferior edge of the superior orbital rim. The lateral injection was again in the upper one third of the forehead vertically above the lateral limbus of the cornea, 1.5 cm lateral to the medial injection site.  -Temporalis muscle injection, 5 sites, bilaterally. The first injection was 3 cm above the tragus of the ear, second injection site was 1.5 cm to 3 cm up from the first injection site in line with the tragus of the ear. The third injection site was 1.5-3 cm forward between the first 2 injection sites. The fourth injection site was 1.5 cm posterior to the second injection site. 5th site laterally in the temporalis  muscleat the level of the outer canthus.  -Occipitalis muscle injection, 3 sites, bilaterally. The first injection was done one half way between the occipital protuberance and the tip of the mastoid process behind the ear. The second injection site was done lateral and superior to the first, 1 fingerbreadth from the first injection. The third injection site was 1 fingerbreadth superiorly and medially from the first injection site.  -Cervical paraspinal muscle injection, 2 sites, bilateral knee first injection site was 1 cm from the midline of the cervical spine,  3 cm inferior to the lower border of the occipital protuberance. The second injection site was 1.5 cm superiorly and laterally to the first injection site.  -Trapezius muscle injection was performed at 3 sites, bilaterally. The first injection site was in the upper trapezius muscle halfway between the inflection point of the neck, and the acromion. The second injection site was one half way between the acromion and the first injection site. The third injection was done between the first injection site and the inflection point of the  neck.   Will return for repeat injection in 3 months.   A 200 units of Botox prepared, 155 unites injected and 45 units of Botox was wasted. The patient tolerated the procedure well, there were no complications of the above procedure.

## 2023-06-21 ENCOUNTER — Ambulatory Visit: Payer: Medicaid Other | Admitting: Family Medicine

## 2023-06-21 DIAGNOSIS — G43709 Chronic migraine without aura, not intractable, without status migrainosus: Secondary | ICD-10-CM

## 2023-08-16 IMAGING — DX DG FINGER MIDDLE 2+V*L*
3 series · 3 of 3 positions shown · non-contrast
Comparison: None.

CLINICAL DATA: Left middle finger injury

EXAM:
LEFT MIDDLE FINGER 2+V

[finger pa]
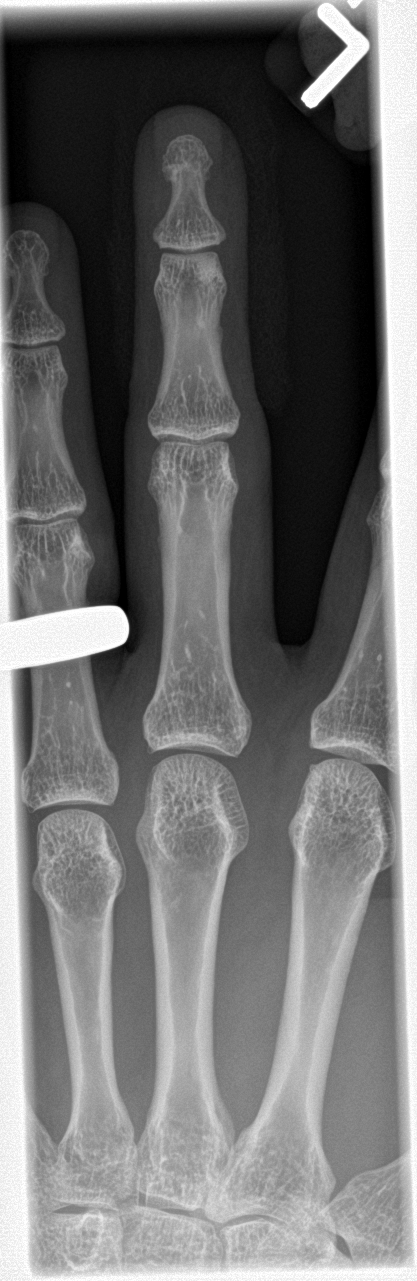

[finger obl]
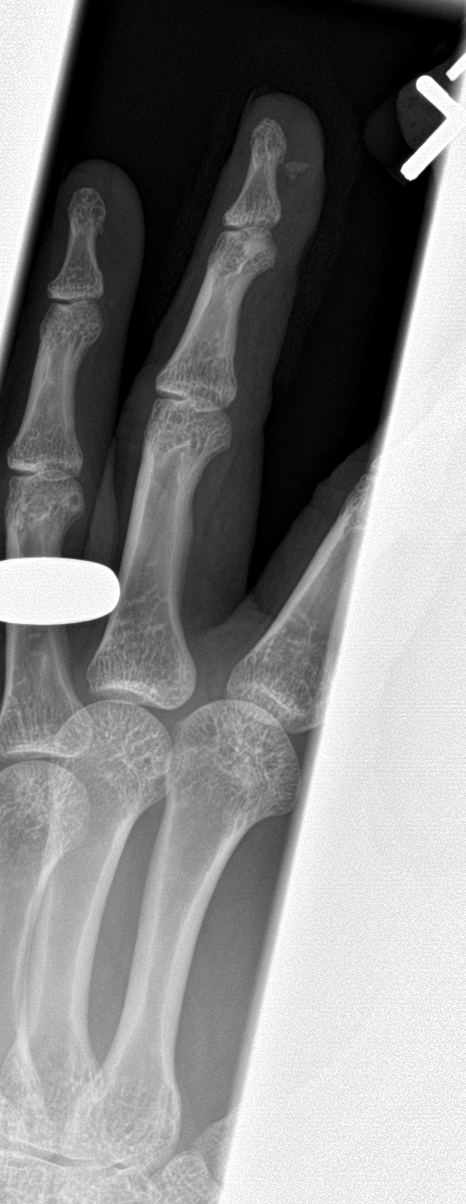

[finger lat]
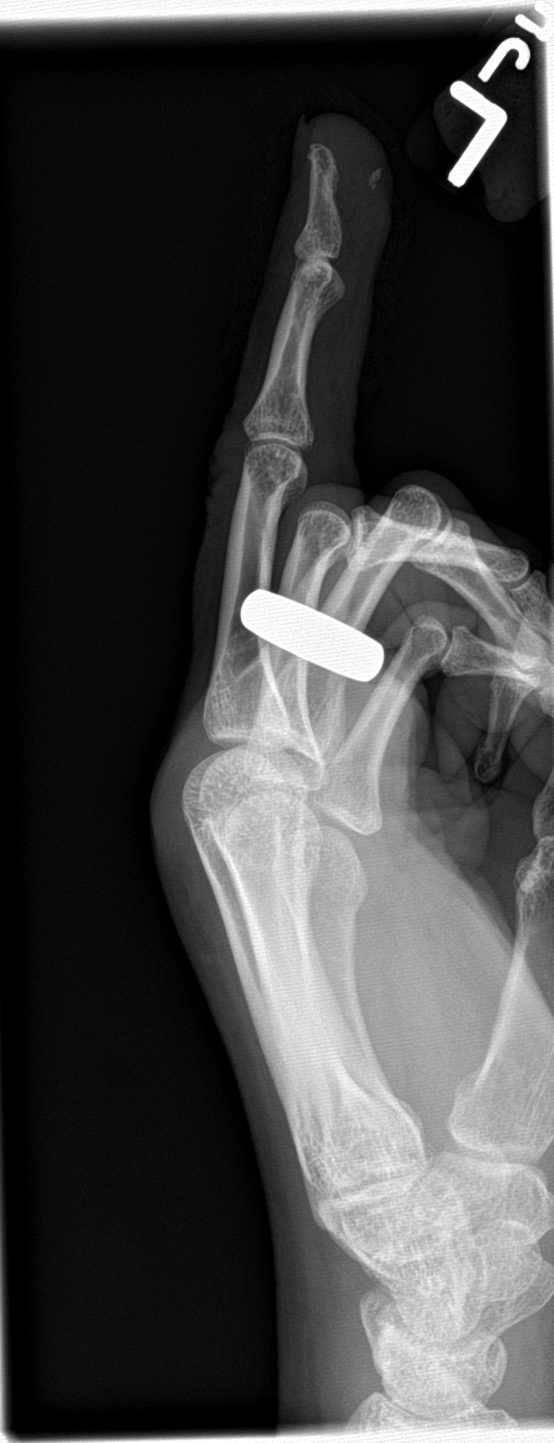

[3 of 3 positions shown; findings below may reference images not displayed]

FINDINGS: Normal alignment. No acute fracture or dislocation. Joint spaces are
preserved. There are 2 retained radiopaque foreign body seed within
the volar soft tissues of the middle finger subjacent to the distal
phalanx measuring 3 mm and 0.8 mm.
IMPRESSION: Two retained radiopaque foreign bodies within the a distal volar
aspect of the left middle finger.

## 2023-08-30 NOTE — Progress Notes (Unsigned)
Consent Form Botulism Toxin Injection For Chronic Migraine  09/03/2023 ALL: Kelly Marsh returns for Botox. Last procedure 02/2023.   03/08/2023 ALL: Kelly Marsh returns for Botox. She continues to do well. She may have 4-5 migraines per month. Eletriptan helps most of the time. She rarely needs to repeat dosing. I have advised her to try taking Eletriptan with a dose of Excedrin to see if this works better for her.   09/19/2022 ALL: Kelly Marsh returns for Botox. She continues to do well. Migraines are well managed.   06/19/2022 ALL: Kelly Marsh returns for Botox. She reports improvement over the past 12 weeks. She may have 3-4 migraines a month. Eletriptan works well for abortive therapy. She reports BP is usually 120-130/80's. Today's reading is not typical.   03/27/2022 ALL: Kelly Marsh returns for Botox. Last procedure 09/2021. She reports near daily headaches since missing last appt in April. Most are migrainous. She continues eletriptan for abortive therapy.   10/05/2020 ALL: She returns for Botox. She has had more migraines, recently, but doing well. Eletriptan works for abortive therapy.   07/06/2021 ALL: She continues to do well on Botox. 4-5 migraine days per month. Eletriptan works well. She has a migraine today but was scared to take abortive meds. She will take when she gets home.   04/04/2021 ALL: She is doing well, today. She continues to have about 4-8 migraine days each month. Eletriptan was effective for abortive therapy. She has decided to continue Botox and does not wish to pursue [facility] referral at this time.   12/01/2020 ALL: She continues Botox therapy. She feels it helps some but continues to have 4-8 migraines per month. She has tried and failed multiple prevention and abortive meds. She is not taking anything for abortive therapy. Previously tried sumatriptan, rizatriptan, frovatriptan, naproxen, toradol, Ubrelvy. She has not heard back from [facility] and was encouraged to call to schedule appt. I will call in  eletriptan to see if this helps. Appropriate administration discussed with patient.   08/30/2020 ALL: She was referred to [facility] for intractable migraines, not relieved with abortive therapies. She has about 4-5 migraines per month. She does not use abortive therapies as they are ineffective. Botox seems to be helping. She is trying to avoid triggers.   Interval history 05/13/2020 AA: has a tooth infection and getting more migraines since then but still she has done exceptionally well with this treatment. >50% reduction in frequency of migraines and headaches since starting botox. Patient has tried multiple acute meds.Clenching is a problem. She feels as though she still has severe headaches, nothing we have given her has helped with acute management and the migraines she still gets are severe we will refer to [facility].   [address]: [place]  Reviewed orally with patient, additionally signature is on file:  Botulism toxin has been approved by the Federal drug administration for treatment of chronic migraine. Botulism toxin does not cure chronic migraine and it may not be effective in some patients.  The administration of botulism toxin is accomplished by injecting a small amount of toxin into the muscles of the neck and head. Dosage must be titrated for each individual. Any benefits resulting from botulism toxin tend to wear off after 3 months with a repeat injection required if benefit is to be maintained. Injections are usually done every 3-4 months with maximum effect peak achieved by about 2 or 3 weeks. Botulism toxin is expensive and you should be sure of what costs you will incur  resulting from the injection.  The side effects of botulism toxin use for chronic migraine may include:   -Transient, and usually mild, facial weakness with facial injections  -Transient, and usually mild, head or neck weakness with head/neck injections  -Reduction or loss of forehead facial  animation due to forehead muscle weakness  -Eyelid drooping  -Dry eye  -Pain at the site of injection or bruising at the site of injection  -Double vision  -Potential unknown long term risks  Contraindications: You should not have Botox if you are pregnant, nursing, allergic to albumin, have an infection, skin condition, or muscle weakness at the site of the injection, or have myasthenia gravis, Lambert-Eaton syndrome, or ALS.  It is also possible that as with any injection, there may be an allergic reaction or no effect from the medication. Reduced effectiveness after repeated injections is sometimes seen and rarely infection at the injection site may occur. All care will be taken to prevent these side effects. If therapy is given over a long time, atrophy and wasting in the muscle injected may occur. Occasionally the patient's become refractory to treatment because they develop antibodies to the toxin. In this event, therapy needs to be modified.  I have read the above information and consent to the administration of botulism toxin.    BOTOX PROCEDURE NOTE FOR MIGRAINE HEADACHE    Contraindications and precautions discussed with patient(above). Aseptic procedure was observed and patient tolerated procedure. Procedure performed by Shawnie Dapper, FNP-C.   The condition has existed for more than 6 months, and pt does not have a diagnosis of ALS, Myasthenia Gravis or Lambert-Eaton Syndrome.  Risks and benefits of injections discussed and pt agrees to proceed with the procedure.  Written consent obtained  These injections are medically necessary. Pt  receives good benefits from these injections. These injections do not cause sedations or hallucinations which the oral therapies may cause.  Description of procedure:  The patient was placed in a sitting position. The standard protocol was used for Botox as follows, with 5 units of Botox injected at each site:   -Procerus muscle, midline  injection  -Corrugator muscle, bilateral injection  -Frontalis muscle, bilateral injection, with 2 sites each side, medial injection was performed in the upper one third of the frontalis muscle, in the region vertical from the medial inferior edge of the superior orbital rim. The lateral injection was again in the upper one third of the forehead vertically above the lateral limbus of the cornea, 1.5 cm lateral to the medial injection site.  -Temporalis muscle injection, 5 sites, bilaterally. The first injection was 3 cm above the tragus of the ear, second injection site was 1.5 cm to 3 cm up from the first injection site in line with the tragus of the ear. The third injection site was 1.5-3 cm forward between the first 2 injection sites. The fourth injection site was 1.5 cm posterior to the second injection site. 5th site laterally in the temporalis  muscleat the level of the outer canthus.  -Occipitalis muscle injection, 3 sites, bilaterally. The first injection was done one half way between the occipital protuberance and the tip of the mastoid process behind the ear. The second injection site was done lateral and superior to the first, 1 fingerbreadth from the first injection. The third injection site was 1 fingerbreadth superiorly and medially from the first injection site.  -Cervical paraspinal muscle injection, 2 sites, bilateral knee first injection site was 1 cm from the midline of  the cervical spine, 3 cm inferior to the lower border of the occipital protuberance. The second injection site was 1.5 cm superiorly and laterally to the first injection site.  -Trapezius muscle injection was performed at 3 sites, bilaterally. The first injection site was in the upper trapezius muscle halfway between the inflection point of the neck, and the acromion. The second injection site was one half way between the acromion and the first injection site. The third injection was done between the first injection  site and the inflection point of the neck.   Will return for repeat injection in 3 months.   A 200 units of Botox prepared, 155 unites injected and 45 units of Botox was wasted. The patient tolerated the procedure well, there were no complications of the above procedure.

## 2023-08-31 ENCOUNTER — Telehealth: Payer: Self-pay | Admitting: Neurology

## 2023-08-31 NOTE — Telephone Encounter (Signed)
Pt called to r/s upcoming appt. Rescheduled

## 2023-09-03 ENCOUNTER — Ambulatory Visit: Payer: Medicaid Other | Admitting: Family Medicine

## 2023-09-03 DIAGNOSIS — G43709 Chronic migraine without aura, not intractable, without status migrainosus: Secondary | ICD-10-CM

## 2023-11-27 NOTE — Progress Notes (Deleted)
 11/28/2023 ALL: Kelly Marsh returns for Botox.   03/08/2023 ALL: Kelly Marsh returns for Botox. She continues to do well. She may have 4-5 migraines per month. Eletriptan helps most of the time. She rarely needs to repeat dosing. I have advised her to try taking Eletriptan with a dose of Excedrin to see if this works better for her.   09/19/2022 ALL: Kelly Marsh returns for Botox. She continues to do well. Migraines are well managed.   06/19/2022 ALL: Kelly Marsh returns for Botox. She reports improvement over the past 12 weeks. She may have 3-4 migraines a month. Eletriptan works well for abortive therapy. She reports BP is usually 120-130/80's. Today's reading is not typical.   03/27/2022 ALL: Kelly Marsh returns for Botox. Last procedure 09/2021. She reports near daily headaches since missing last appt in April. Most are migrainous. She continues eletriptan for abortive therapy.   10/05/2020 ALL: She returns for Botox. She has had more migraines, recently, but doing well. Eletriptan works for abortive therapy.   07/06/2021 ALL: She continues to do well on Botox. 4-5 migraine days per month. Eletriptan works well. She has a migraine today but was scared to take abortive meds. She will take when she gets home.   04/04/2021 ALL: She is doing well, today. She continues to have about 4-8 migraine days each month. Eletriptan was effective for abortive therapy. She has decided to continue Botox and does not wish to pursue [facility] referral at this time.   12/01/2020 ALL: She continues Botox therapy. She feels it helps some but continues to have 4-8 migraines per month. She has tried and failed multiple prevention and abortive meds. She is not taking anything for abortive therapy. Previously tried sumatriptan, rizatriptan, frovatriptan, naproxen, toradol, Ubrelvy. She has not heard back from [facility] and was encouraged to call to schedule appt. I will call in eletriptan to see if this helps. Appropriate administration discussed with patient.    08/30/2020 ALL: She was referred to [facility] for intractable migraines, not relieved with abortive therapies. She has about 4-5 migraines per month. She does not use abortive therapies as they are ineffective. Botox seems to be helping. She is trying to avoid triggers.   Interval history 05/13/2020 AA: has a tooth infection and getting more migraines since then but still she has done exceptionally well with this treatment. >50% reduction in frequency of migraines and headaches since starting botox. Patient has tried multiple acute meds.Clenching is a problem. She feels as though she still has severe headaches, nothing we have given her has helped with acute management and the migraines she still gets are severe we will refer to [facility].   [address]: [place]   Consent Form Botulism Toxin Injection For Chronic Migraine  Reviewed orally with patient, additionally signature is on file:  Botulism toxin has been approved by the Federal drug administration for treatment of chronic migraine. Botulism toxin does not cure chronic migraine and it may not be effective in some patients.  The administration of botulism toxin is accomplished by injecting a small amount of toxin into the muscles of the neck and head. Dosage must be titrated for each individual. Any benefits resulting from botulism toxin tend to wear off after 3 months with a repeat injection required if benefit is to be maintained. Injections are usually done every 3-4 months with maximum effect peak achieved by about 2 or 3 weeks. Botulism toxin is expensive and you should be sure of what costs you will incur resulting from  the injection.  The side effects of botulism toxin use for chronic migraine may include:   -Transient, and usually mild, facial weakness with facial injections  -Transient, and usually mild, head or neck weakness with head/neck injections  -Reduction or loss of forehead facial animation due to forehead  muscle weakness  -Eyelid drooping  -Dry eye  -Pain at the site of injection or bruising at the site of injection  -Double vision  -Potential unknown long term risks  Contraindications: You should not have Botox if you are pregnant, nursing, allergic to albumin, have an infection, skin condition, or muscle weakness at the site of the injection, or have myasthenia gravis, Lambert-Eaton syndrome, or ALS.  It is also possible that as with any injection, there may be an allergic reaction or no effect from the medication. Reduced effectiveness after repeated injections is sometimes seen and rarely infection at the injection site may occur. All care will be taken to prevent these side effects. If therapy is given over a long time, atrophy and wasting in the muscle injected may occur. Occasionally the patient's become refractory to treatment because they develop antibodies to the toxin. In this event, therapy needs to be modified.  I have read the above information and consent to the administration of botulism toxin.  BOTOX PROCEDURE NOTE FOR MIGRAINE HEADACHE  Contraindications and precautions discussed with patient(above). Aseptic procedure was observed and patient tolerated procedure. Procedure performed by Shawnie Dapper, FNP-C.   The condition has existed for more than 6 months, and pt does not have a diagnosis of ALS, Myasthenia Gravis or Lambert-Eaton Syndrome.  Risks and benefits of injections discussed and pt agrees to proceed with the procedure.  Written consent obtained  These injections are medically necessary. Pt  receives good benefits from these injections. These injections do not cause sedations or hallucinations which the oral therapies may cause.  Description of procedure:  The patient was placed in a sitting position. The standard protocol was used for Botox as follows, with 5 units of Botox injected at each site:   -Procerus muscle, midline injection  -Corrugator muscle, bilateral  injection  -Frontalis muscle, bilateral injection, with 2 sites each side, medial injection was performed in the upper one third of the frontalis muscle, in the region vertical from the medial inferior edge of the superior orbital rim. The lateral injection was again in the upper one third of the forehead vertically above the lateral limbus of the cornea, 1.5 cm lateral to the medial injection site.  -Temporalis muscle injection, 5 sites, bilaterally. The first injection was 3 cm above the tragus of the ear, second injection site was 1.5 cm to 3 cm up from the first injection site in line with the tragus of the ear. The third injection site was 1.5-3 cm forward between the first 2 injection sites. The fourth injection site was 1.5 cm posterior to the second injection site. 5th site laterally in the temporalis  muscleat the level of the outer canthus.  -Occipitalis muscle injection, 3 sites, bilaterally. The first injection was done one half way between the occipital protuberance and the tip of the mastoid process behind the ear. The second injection site was done lateral and superior to the first, 1 fingerbreadth from the first injection. The third injection site was 1 fingerbreadth superiorly and medially from the first injection site.  -Cervical paraspinal muscle injection, 2 sites, bilateral knee first injection site was 1 cm from the midline of the cervical spine, 3 cm inferior  to the lower border of the occipital protuberance. The second injection site was 1.5 cm superiorly and laterally to the first injection site.  -Trapezius muscle injection was performed at 3 sites, bilaterally. The first injection site was in the upper trapezius muscle halfway between the inflection point of the neck, and the acromion. The second injection site was one half way between the acromion and the first injection site. The third injection was done between the first injection site and the inflection point of the  neck.   Will return for repeat injection in 3 months.   A 200 units of Botox prepared, 155 unites injected and 45 units of Botox was wasted. The patient tolerated the procedure well, there were no complications of the above procedure.

## 2023-11-28 ENCOUNTER — Encounter: Payer: Self-pay | Admitting: Family Medicine

## 2023-11-28 ENCOUNTER — Ambulatory Visit: Payer: Medicaid Other | Admitting: Family Medicine

## 2023-11-28 DIAGNOSIS — G43709 Chronic migraine without aura, not intractable, without status migrainosus: Secondary | ICD-10-CM

## 2024-02-26 ENCOUNTER — Ambulatory Visit: Admitting: Family Medicine

## 2024-09-17 ENCOUNTER — Ambulatory Visit (INDEPENDENT_AMBULATORY_CARE_PROVIDER_SITE_OTHER): Admitting: Podiatry

## 2024-09-17 DIAGNOSIS — M216X1 Other acquired deformities of right foot: Secondary | ICD-10-CM | POA: Diagnosis not present

## 2024-09-17 DIAGNOSIS — M216X2 Other acquired deformities of left foot: Secondary | ICD-10-CM | POA: Diagnosis not present

## 2024-09-17 NOTE — Progress Notes (Signed)
"  °  Subjective:  Patient ID: Kelly Marsh, female    DOB: 1976/06/27,  MRN: [id number]  Chief Complaint  Patient presents with   Toe Pain    Pt stated that she feels like she has something in her toe     48 y.o. female presents with the above complaint.  Patient presents with bilateral flatfoot deformity.  Patient states that is causing her some discomfort wanted to get it evaluated she has mild bunion deformities as well.  Pain scale 7 out of 10.  Aching nature.  She would like to discuss orthotics options she currently does not wear any orthotics.   Review of Systems: Negative except as noted in the HPI. Denies N/V/F/Ch.  Past Medical History:  Diagnosis Date   Fatigue    Migraine    Snoring    Vitamin B12 deficiency    Vitamin D deficiency    Current Medications[1]  Tobacco Use History[2]  Allergies[3] Objective:  There were no vitals filed for this visit. There is no height or weight on file to calculate BMI. Constitutional Well developed. Well nourished.  Vascular Dorsalis pedis pulses palpable bilaterally. Posterior tibial pulses palpable bilaterally. Capillary refill normal to all digits.  No cyanosis or clubbing noted. Pedal hair growth normal.  Neurologic Normal speech. Oriented to person, place, and time. Epicritic sensation to light touch grossly present bilaterally.  Dermatologic Nails well groomed and normal in appearance. No open wounds. No skin lesions.  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  The arch with dorsiflexion of the hallux able to perform single and double heel raise.  Semiflexible flatfoot deformity noted   Radiographs: None Assessment:   1. Other acquired deformities of left foot   2. Other acquired deformities of right foot    Plan:  Patient was evaluated and treated and all questions answered.  Pes planovalgus/foot deformity -I explained to patient the etiology of pes  planovalgus and relationship with heel pain/arch pain and various treatment options were discussed.  Given patient foot structure in the setting of heel pain/arch pain I believe patient will benefit from custom-made orthotics to help control the hindfoot motion support the arch of the foot and take the stress away from arches.  Patient agrees with the plan like to proceed with orthotics -Patient was casted for orthotics  -  No follow-ups on file.     [1]  Current Outpatient Medications:    botulinum toxin Type A  (BOTOX ) 100 units SOLR injection, PROVIDER TO INJECT 155 UNITS INTO THE MUSCLES OF THE HEAD AND NECK EVERY 3 MONTHS. DISCARD REMAINDER., Disp: 2 each, Rfl: 1   Cholecalciferol (VITAMIN D3) 1.25 MG (50000 UT) CAPS, Take 1 capsule by mouth. Every other week, Disp: , Rfl:    desogestrel-ethinyl estradiol (VELIVET) 0.1/0.125/0.15 -0.025 MG tablet, Take 1 tablet by mouth daily., Disp: , Rfl:    eletriptan  (RELPAX ) 40 MG tablet, Take 1 tablet (40 mg total) by mouth as needed for migraine or headache. May repeat in 2 hours if headache persists or recurs., Disp: 10 tablet, Rfl: 0   famotidine  (PEPCID ) 20 MG tablet, Take 1 tablet (20 mg total) by mouth 2 (two) times daily., Disp: 30 tablet, Rfl: 0 [2]  Social History Tobacco Use  Smoking Status Never  Smokeless Tobacco Never  [3] No Known Allergies  "

## 2024-10-03 ENCOUNTER — Telehealth: Payer: Self-pay | Admitting: Podiatry

## 2024-10-03 NOTE — Telephone Encounter (Signed)
 [facility] in [place] called patient scheduled 1/30 for [facility] in [place]

## 2024-10-17 ENCOUNTER — Ambulatory Visit (INDEPENDENT_AMBULATORY_CARE_PROVIDER_SITE_OTHER): Admitting: Podiatrist

## 2024-10-17 DIAGNOSIS — M216X1 Other acquired deformities of right foot: Secondary | ICD-10-CM

## 2024-10-17 DIAGNOSIS — M216X2 Other acquired deformities of left foot: Secondary | ICD-10-CM

## 2024-10-17 NOTE — Progress Notes (Signed)
